# Patient Record
Sex: Female | Born: 1985 | Race: White | Hispanic: No | Marital: Married | Smoking: Never smoker
Health system: Southern US, Community
[De-identification: ages and names within clinical notes are randomized; demographics above are authoritative.]

## PROBLEM LIST (undated history)

## (undated) DIAGNOSIS — Z87440 Personal history of urinary (tract) infections: Secondary | ICD-10-CM

## (undated) DIAGNOSIS — Z862 Personal history of diseases of the blood and blood-forming organs and certain disorders involving the immune mechanism: Secondary | ICD-10-CM

## (undated) DIAGNOSIS — F909 Attention-deficit hyperactivity disorder, unspecified type: Secondary | ICD-10-CM

## (undated) DIAGNOSIS — S92912A Unspecified fracture of left toe(s), initial encounter for closed fracture: Secondary | ICD-10-CM

## (undated) DIAGNOSIS — S62109A Fracture of unspecified carpal bone, unspecified wrist, initial encounter for closed fracture: Secondary | ICD-10-CM

## (undated) DIAGNOSIS — F32A Depression, unspecified: Secondary | ICD-10-CM

## (undated) DIAGNOSIS — F419 Anxiety disorder, unspecified: Secondary | ICD-10-CM

## (undated) DIAGNOSIS — T7840XA Allergy, unspecified, initial encounter: Secondary | ICD-10-CM

## (undated) DIAGNOSIS — S42401D Unspecified fracture of lower end of right humerus, subsequent encounter for fracture with routine healing: Secondary | ICD-10-CM

## (undated) DIAGNOSIS — F329 Major depressive disorder, single episode, unspecified: Secondary | ICD-10-CM

## (undated) DIAGNOSIS — N83209 Unspecified ovarian cyst, unspecified side: Secondary | ICD-10-CM

## (undated) HISTORY — DX: Personal history of diseases of the blood and blood-forming organs and certain disorders involving the immune mechanism: Z86.2

## (undated) HISTORY — PX: OTHER SURGICAL HISTORY: SHX169

## (undated) HISTORY — DX: Fracture of unspecified carpal bone, unspecified wrist, initial encounter for closed fracture: S62.109A

## (undated) HISTORY — DX: Unspecified fracture of lower end of right humerus, subsequent encounter for fracture with routine healing: S42.401D

## (undated) HISTORY — DX: Unspecified ovarian cyst, unspecified side: N83.209

## (undated) HISTORY — DX: Personal history of urinary (tract) infections: Z87.440

## (undated) HISTORY — DX: Depression, unspecified: F32.A

## (undated) HISTORY — DX: Attention-deficit hyperactivity disorder, unspecified type: F90.9

## (undated) HISTORY — DX: Unspecified fracture of left toe(s), initial encounter for closed fracture: S92.912A

## (undated) HISTORY — DX: Allergy, unspecified, initial encounter: T78.40XA

## (undated) HISTORY — DX: Anxiety disorder, unspecified: F41.9

## (undated) HISTORY — PX: DENTAL SURGERY: SHX609

---
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
## 2008-08-21 HISTORY — PX: DENTAL SURGERY: SHX609

## 2012-11-07 DIAGNOSIS — F909 Attention-deficit hyperactivity disorder, unspecified type: Secondary | ICD-10-CM | POA: Insufficient documentation

## 2013-01-23 DIAGNOSIS — R87619 Unspecified abnormal cytological findings in specimens from cervix uteri: Secondary | ICD-10-CM | POA: Insufficient documentation

## 2019-11-10 ENCOUNTER — Ambulatory Visit: Payer: Self-pay | Attending: Internal Medicine

## 2019-11-10 DIAGNOSIS — Z23 Encounter for immunization: Secondary | ICD-10-CM

## 2019-11-10 NOTE — Progress Notes (Addendum)
   Covid-19 Vaccination Clinic  Name:  Maelle Sheaffer    MRN: [id number] DOB: 06-08-1986  11/10/2019  [name] was observed post Covid-19 immunization for 30 minutes without incident. She was provided with Vaccine Information Sheet and instruction to access the V-Safe system.   Ms. Wilhemina Bonito was instructed to call 911 with any severe reactions post vaccine: Marland Kitchen Difficulty breathing  . Swelling of face and throat  . A fast heartbeat  . A bad rash all over body  . Dizziness and weakness   Immunizations Administered    Name Date Dose VIS Date Route   Pfizer COVID-19 Vaccine 11/10/2019 11:03 AM 0.3 mL 08/01/2019 Intramuscular   Manufacturer: [employer], [employer]   Lot: [id number]   NDC: [address]-3

## 2019-11-10 NOTE — Progress Notes (Signed)
   Covid-19 Vaccination Clinic  Name:  Jessica Aguilar    MRN: [id number] DOB: 1986-06-12  11/10/2019  [name] was observed post Covid-19 immunization for 30 minutes based on pre-vaccination screening .  During the observation period, she experienced an adverse reaction with the following symptoms:  dizziness and anxiety  Assessment : Time of assessment 1129am. Alert and oriented, anxious.  Actions taken: took to stretcher, VS taken- BP- 124/84. P-79, O2 - 100%, Patient eating crackers and drinking water, recheck VS- BP- 123/89. P- 82. O2 - 100%     Medications administered: No medication administered.  Disposition: Reports no further symptoms of adverse reaction after observation for 30 minutes. Discharged home.   Immunizations Administered    Name Date Dose VIS Date Route   Pfizer COVID-19 Vaccine 11/10/2019 11:03 AM 0.3 mL 08/01/2019 Intramuscular   Manufacturer: [employer], [employer]   Lot: [id number]   NDC: [address]-0

## 2019-11-14 ENCOUNTER — Telehealth: Payer: Self-pay | Admitting: *Deleted

## 2019-11-14 NOTE — Telephone Encounter (Signed)
An attempt has been made to call Jessica Aguilar regarding the COVID vaccine that she received on March 22,2021 to see how she was feeling post vaccine. Left a detailed voicemail asking for the patient to call me back at [phone] to go over how she was feeling in detail.

## 2019-11-18 NOTE — Telephone Encounter (Signed)
A second attempt has been made to call the patient. Left a voicemail asking for the patient to call back to discuss the COVID vaccine with any questions or concerns.

## 2019-11-24 ENCOUNTER — Telehealth: Payer: Self-pay | Admitting: Obstetrics and Gynecology

## 2019-11-24 NOTE — Telephone Encounter (Signed)
Pt called in and stated that she got her period after 8 months and the pt is in some pain. She is having back pain. The pt called in and said her periods are not regular. Pt is requesting a  call back. Please advise

## 2019-11-24 NOTE — Telephone Encounter (Signed)
Unfortunately patient has never been seen in this office so unable to give give medical advise over the phone. She is scheduled for 12/09/2019. I have offered an earlier appointment, but patient is unable to do a sooner date.

## 2019-11-26 NOTE — Telephone Encounter (Signed)
Patient called returning Jessica Aguilar's voicemail. Asked patient if Apolonio Schneiders could call her back, patient stated she was at work and asked to relay a message. Patient states she is doing fine and ready for her next vaccine next week. Patient states she did have some dizziness after, but was uncertain if that was from the vaccine or if she was hungry. Patient states she was a little nauseous at work the next three days, but informs she is not pregnant.  Please advise.

## 2019-11-26 NOTE — Telephone Encounter (Signed)
Dr. Dellis Anes please advise if this is any need for concern. Thank You.

## 2019-11-27 NOTE — Telephone Encounter (Signed)
I agree - I do not think that there is anything else needed at this point. Thank you for reaching out to her to discuss!   Malachi Bonds, MD [facility]

## 2019-11-27 NOTE — Telephone Encounter (Signed)
Called and left detailed voicemail per patient's request. Asked for patient to call back with any further questions or concerns.

## 2019-12-03 ENCOUNTER — Ambulatory Visit: Payer: Self-pay | Attending: Internal Medicine

## 2019-12-03 DIAGNOSIS — Z23 Encounter for immunization: Secondary | ICD-10-CM

## 2019-12-03 NOTE — Progress Notes (Signed)
   Covid-19 Vaccination Clinic  Name:  Jessica Aguilar    MRN: [id number] DOB: 1986-05-30  12/03/2019  Jessica Aguilar was observed post Covid-19 immunization for 15 minutes without incident. She was provided with Vaccine Information Sheet and instruction to access the V-Safe system.   Jessica Aguilar was instructed to call 911 with any severe reactions post vaccine: Marland Kitchen Difficulty breathing  . Swelling of face and throat  . A fast heartbeat  . A bad rash all over body  . Dizziness and weakness   Immunizations Administered    Name Date Dose VIS Date Route   Pfizer COVID-19 Vaccine 12/03/2019  2:52 PM 0.3 mL 08/01/2019 Intramuscular   Manufacturer: [employer], [employer]   Lot: [id number]   NDC: [address]-5

## 2019-12-09 ENCOUNTER — Encounter: Payer: Self-pay | Admitting: Obstetrics and Gynecology

## 2019-12-09 ENCOUNTER — Other Ambulatory Visit: Payer: Self-pay

## 2019-12-09 ENCOUNTER — Ambulatory Visit (INDEPENDENT_AMBULATORY_CARE_PROVIDER_SITE_OTHER): Payer: 59 | Admitting: Obstetrics and Gynecology

## 2019-12-09 VITALS — BP 105/73 | HR 79 | Ht 67.0 in | Wt 190.4 lb

## 2019-12-09 DIAGNOSIS — R102 Pelvic and perineal pain: Secondary | ICD-10-CM | POA: Diagnosis not present

## 2019-12-09 DIAGNOSIS — N938 Other specified abnormal uterine and vaginal bleeding: Secondary | ICD-10-CM | POA: Diagnosis not present

## 2019-12-09 DIAGNOSIS — Z3009 Encounter for other general counseling and advice on contraception: Secondary | ICD-10-CM | POA: Diagnosis not present

## 2019-12-09 MED ORDER — ETONOGESTREL-ETHINYL ESTRADIOL 0.12-0.015 MG/24HR VA RING: VAGINAL_RING | VAGINAL | 1 refills | Status: DC

## 2019-12-09 NOTE — Progress Notes (Signed)
HPI:      [name] is a 34 y.o. No obstetric history on file. who LMP was Patient's last menstrual period was 11/21/2019.  Subjective:   She presents today with complaint of irregular menstrual periods.  She is also complaining of occasional pelvic cramping-is currently bleeding.  States that her cramping is present but "not bad enough to take ibuprofen". Patient states that she has had irregular cycles her whole life. She was on Nexplanon for a while and felt it increased her anxiety and stress.  She had it removed but her anxiety and stress did not resolve.  She began Depo-Provera but did not like the weight gain.  She stopped Depo-Provera in February.  Since that time she has had some irregular bleeding. Patient reports a family history of endometriosis.  She has never been diagnosed with this. She states that she strongly dislikes birth control pills.  She says she cannot take pills very well.    Hx: The following portions of the patient's history were reviewed and updated as appropriate:             She  has a past medical history of ADHD, Anxiety, Depression, and Ovarian cyst. She does not have a problem list on file. She  has no past surgical history on file. Her family history includes Arthritis in her maternal grandmother; Breast cancer in her paternal grandmother; Depression in her paternal grandmother; Diabetes in her father; Heart failure in her maternal grandmother; Kidney disease in her father. She  reports that she has been smoking. She has never used smokeless tobacco. She reports current alcohol use of about 3.0 standard drinks of alcohol per week. She reports that she does not use drugs. She has a current medication list which includes the following prescription(s): etonogestrel-ethinyl estradiol. She is allergic to amoxicillin.       Review of Systems:  Review of Systems  Constitutional: Denied constitutional symptoms, night sweats, recent illness, fatigue, fever,  insomnia and weight loss.  Eyes: Denied eye symptoms, eye pain, photophobia, vision change and visual disturbance.  Ears/Nose/Throat/Neck: Denied ear, nose, throat or neck symptoms, hearing loss, nasal discharge, sinus congestion and sore throat.  Cardiovascular: Denied cardiovascular symptoms, arrhythmia, chest pain/pressure, edema, exercise intolerance, orthopnea and palpitations.  Respiratory: Denied pulmonary symptoms, asthma, pleuritic pain, productive sputum, cough, dyspnea and wheezing.  Gastrointestinal: Denied, gastro-esophageal reflux, melena, nausea and vomiting.  Genitourinary: See HPI for additional information.  Musculoskeletal: Denied musculoskeletal symptoms, stiffness, swelling, muscle weakness and myalgia.  Dermatologic: Denied dermatology symptoms, rash and scar.  Neurologic: Denied neurology symptoms, dizziness, headache, neck pain and syncope.  Psychiatric: Denied psychiatric symptoms, anxiety and depression.  Endocrine: Denied endocrine symptoms including hot flashes and night sweats.   Meds:   No current outpatient medications on file prior to visit.   No current facility-administered medications on file prior to visit.    Objective:     Vitals:   12/09/19 1059  BP: 105/73  Pulse: 79                Assessment:    No obstetric history on file. There are no problems to display for this patient.    1. Birth control counseling   2. Pelvic pain in female   3. Dysfunctional uterine bleeding     Dysfunctional bleeding likely secondary to stopping Depo-Provera.  Some dysmenorrhea/pelvic pain but "not bad enough to take ibuprofen".  She describes a history of ovarian cysts.   Plan:  1.  Based on her pelvic pain with menses, her regular cycles, her need for birth control, and the possibility of endometriosis I have recommended birth control/cycle control.  OCPs or IUD.  Both explained in detail.  Patient was not happy with either of these  choices. She has chosen NuvaRing. Recommend follow-up of NuvaRing in 3 months to see if her symptoms have improved.  Also to see if she likes the NuvaRing. Annual examination at the time of next visit. Orders No orders of the defined types were placed in this encounter.    Meds ordered this encounter  Medications  . etonogestrel-ethinyl estradiol (NUVARING) 0.12-0.015 MG/24HR vaginal ring    Sig: Insert vaginally and leave in place for 3 consecutive weeks, then remove for 1 week.    Dispense:  3 each    Refill:  1      F/U  Return in about 3 months (around 03/09/2020) for Annual Physical and NuvaRing follow-up. I spent 32 minutes involved in the care of this patient preparing to see the patient by obtaining and reviewing her medical history (including labs, imaging tests and prior procedures), documenting clinical information in the electronic health record (EHR), counseling and coordinating care plans, writing and sending prescriptions, ordering tests or procedures and directly communicating with the patient by discussing pertinent items from her history and physical exam as well as detailing my assessment and plan as noted above so that she has an informed understanding.  All of her questions were answered.  Finis Bud, M.D. 12/09/2019 11:51 AM

## 2020-01-13 ENCOUNTER — Telehealth: Payer: Self-pay | Admitting: Obstetrics and Gynecology

## 2020-01-13 NOTE — Telephone Encounter (Signed)
Please advise 

## 2020-01-13 NOTE — Telephone Encounter (Signed)
Pt called in and stated that she has a question about her BC ( the navaring). She said she did what Dr. Logan Bores told her. The pt stated that she had it in for 3 weeks. Then took it out on 16 th for her period, but she didn't start her period till  Friday night the 21st. On Sunday the 23rd she put a new navaring in . She wants to know is that right. The pt is requesting a call back. Please advise

## 2020-01-15 NOTE — Telephone Encounter (Signed)
LM to return call.

## 2020-03-10 ENCOUNTER — Encounter: Payer: 59 | Admitting: Obstetrics and Gynecology

## 2020-03-16 ENCOUNTER — Telehealth: Payer: Self-pay | Admitting: Obstetrics and Gynecology

## 2020-03-16 ENCOUNTER — Encounter: Payer: Self-pay | Admitting: Obstetrics and Gynecology

## 2020-03-16 ENCOUNTER — Ambulatory Visit (INDEPENDENT_AMBULATORY_CARE_PROVIDER_SITE_OTHER): Payer: 59 | Admitting: Obstetrics and Gynecology

## 2020-03-16 VITALS — BP 103/71 | HR 87 | Ht 67.0 in | Wt 193.7 lb

## 2020-03-16 DIAGNOSIS — Z3049 Encounter for surveillance of other contraceptives: Secondary | ICD-10-CM | POA: Diagnosis not present

## 2020-03-16 NOTE — Telephone Encounter (Signed)
Pt called in and stated that she went to take her NUVARING for the past week. The pt stated that she doesn't know if she took it out of if its still in there. The pt was requesting to be seen today. I told her that I would have to send a message. The pt stated that she started her period but she is worried and doesn't want to get TSS. The pt is requesting a call back. Please advise  

## 2020-03-16 NOTE — Telephone Encounter (Signed)
Pt called in and stated that she went to take her NUVARING for the past week. The pt stated that she doesn't know if she took it out of if its still in there. The pt was requesting to be seen today. I told her that I would have to send a message. The pt stated that she started her period but she is worried and doesn't want to get TSS. The pt is requesting a call back. Please advise

## 2020-03-16 NOTE — Telephone Encounter (Signed)
Scheduled patient to come in today at 11:30.

## 2020-03-16 NOTE — Progress Notes (Addendum)
HPI:      [name] is a 34 y.o. No obstetric history on file. who LMP was Patient's last menstrual period was 03/15/2020.  Subjective:   She presents today because she cannot find her NuvaRing and does not recall removing it.  She started her menstrual period yesterday.  She thinks it may be more than 5 weeks since it was inserted.    Hx: The following portions of the patient's history were reviewed and updated as appropriate:             She  has a past medical history of ADHD, Anxiety, Depression, and Ovarian cyst. She does not have a problem list on file. She  has no past surgical history on file. Her family history includes Arthritis in her maternal grandmother; Breast cancer in her paternal grandmother; Depression in her paternal grandmother; Diabetes in her father; Heart failure in her maternal grandmother; Kidney disease in her father. She  reports that she has been smoking. She has never used smokeless tobacco. She reports current alcohol use of about 3.0 standard drinks of alcohol per week. She reports that she does not use drugs. She has a current medication list which includes the following prescription(s): etonogestrel-ethinyl estradiol. She is allergic to amoxicillin.       Review of Systems:  Review of Systems  Constitutional: Denied constitutional symptoms, night sweats, recent illness, fatigue, fever, insomnia and weight loss.  Eyes: Denied eye symptoms, eye pain, photophobia, vision change and visual disturbance.  Ears/Nose/Throat/Neck: Denied ear, nose, throat or neck symptoms, hearing loss, nasal discharge, sinus congestion and sore throat.  Cardiovascular: Denied cardiovascular symptoms, arrhythmia, chest pain/pressure, edema, exercise intolerance, orthopnea and palpitations.  Respiratory: Denied pulmonary symptoms, asthma, pleuritic pain, productive sputum, cough, dyspnea and wheezing.  Gastrointestinal: Denied, gastro-esophageal reflux, melena, nausea and  vomiting.  Genitourinary: Denied genitourinary symptoms including symptomatic vaginal discharge, pelvic relaxation issues, and urinary complaints.  Musculoskeletal: Denied musculoskeletal symptoms, stiffness, swelling, muscle weakness and myalgia.  Dermatologic: Denied dermatology symptoms, rash and scar.  Neurologic: Denied neurology symptoms, dizziness, headache, neck pain and syncope.  Psychiatric: Denied psychiatric symptoms, anxiety and depression.  Endocrine: Denied endocrine symptoms including hot flashes and night sweats.   Meds:   Current Outpatient Medications on File Prior to Visit  Medication Sig Dispense Refill  . etonogestrel-ethinyl estradiol (NUVARING) 0.12-0.015 MG/24HR vaginal ring Insert vaginally and leave in place for 3 consecutive weeks, then remove for 1 week. 3 each 1   No current facility-administered medications on file prior to visit.    Objective:     Vitals:   03/16/20 1120  BP: 103/71  Pulse: 87              Physical examination   Pelvic:   Vulva: Normal appearance.  No lesions.  Vagina: No lesions or abnormalities noted.  Support: Normal pelvic support.  Urethra No masses tenderness or scarring.  Meatus Normal size without lesions or prolapse.  Cervix: Normal appearance.  No lesions.  Anus: Normal exam.  No lesions.  Perineum: Normal exam.  No lesions.        Bimanual   Uterus: Normal size.  Non-tender.  Mobile.  AV.  Adnexae: No masses.  Non-tender to palpation.  Cul-de-sac: Negative for abnormality.     Assessment:    No obstetric history on file. There are no problems to display for this patient.    1. Encounter for surveillance of nuvaring     No evidence of vaginal NuvaRing present.  Patient is bleeding consistent with menses.   Plan:            1.  Patient to start next NuvaRing on schedule Sunday.  2.  Reassured regarding pregnancy and use of NuvaRing.  Orders No orders of the defined types were placed in this  encounter.   No orders of the defined types were placed in this encounter.     F/U  No follow-ups on file. I spent 21 minutes involved in the care of this patient preparing to see the patient by obtaining and reviewing her medical history (including labs, imaging tests and prior procedures), documenting clinical information in the electronic health record (EHR), counseling and coordinating care plans, writing and sending prescriptions, ordering tests or procedures and directly communicating with the patient by discussing pertinent items from her history and physical exam as well as detailing my assessment and plan as noted above so that she has an informed understanding.  All of her questions were answered.  Elonda Husky, M.D. 03/16/2020 11:57 AM

## 2020-04-07 ENCOUNTER — Other Ambulatory Visit: Payer: Self-pay

## 2020-04-07 DIAGNOSIS — Z20822 Contact with and (suspected) exposure to covid-19: Secondary | ICD-10-CM

## 2020-04-08 LAB — NOVEL CORONAVIRUS, NAA: SARS-CoV-2, NAA: NOT DETECTED

## 2020-04-08 LAB — SARS-COV-2, NAA 2 DAY TAT

## 2020-06-25 ENCOUNTER — Ambulatory Visit: Payer: Self-pay

## 2020-10-27 ENCOUNTER — Ambulatory Visit: Payer: Self-pay

## 2020-12-29 ENCOUNTER — Ambulatory Visit: Payer: Self-pay

## 2020-12-31 ENCOUNTER — Encounter: Payer: Self-pay | Admitting: Family Medicine

## 2020-12-31 ENCOUNTER — Ambulatory Visit (LOCAL_COMMUNITY_HEALTH_CENTER): Payer: Self-pay | Admitting: Family Medicine

## 2020-12-31 ENCOUNTER — Other Ambulatory Visit: Payer: Self-pay

## 2020-12-31 VITALS — BP 104/72 | HR 80 | Ht 67.0 in | Wt 189.6 lb

## 2020-12-31 DIAGNOSIS — Z789 Other specified health status: Secondary | ICD-10-CM

## 2020-12-31 DIAGNOSIS — Z3009 Encounter for other general counseling and advice on contraception: Secondary | ICD-10-CM

## 2020-12-31 DIAGNOSIS — Z30012 Encounter for prescription of emergency contraception: Secondary | ICD-10-CM

## 2020-12-31 DIAGNOSIS — Z0000 Encounter for general adult medical examination without abnormal findings: Secondary | ICD-10-CM

## 2020-12-31 DIAGNOSIS — Z30013 Encounter for initial prescription of injectable contraceptive: Secondary | ICD-10-CM

## 2020-12-31 MED ORDER — MEDROXYPROGESTERONE ACETATE 150 MG/ML IM SUSP
150.0000 mg | INTRAMUSCULAR | Status: AC
  Administered 2020-12-31 – 2021-03-29 (×2): 150 mg via INTRAMUSCULAR

## 2020-12-31 MED ORDER — ELLA 30 MG PO TABS: 1.0000 | ORAL_TABLET | Freq: Once | ORAL | 0 refills | Status: AC

## 2020-12-31 NOTE — Progress Notes (Signed)
[facility] [address] Main Number: [phone]    Family Planning Visit- Initial Visit  Subjective:  Jessica Aguilar is a 35 y.o.  No obstetric history on file.   being seen today for an initial annual visit and to discuss contraceptive options.  The patient is currently using None for pregnancy prevention. Patient reports she does not want a pregnancy in the next year.  Patient has the following medical conditions does not have a problem list on file.  Chief Complaint  Patient presents with  . Annual Exam    Patient reports here for physical and start Depo   Patient denies any problems or concerns   Body mass index is 29.7 kg/m. - Patient is eligible for diabetes screening based on BMI and age >23?  not applicable HA1C ordered? not applicable  Patient reports 1  partner/s in last year. Desires STI screening?  No - declines   Has patient been screened once for HCV in the past?  No  No results found for: HCVAB  Does the patient have current drug use (including MJ), have a partner with drug use, and/or has been incarcerated since last result? No  If yes-- Screen for HCV through [facility]   Does the patient meet criteria for HBV testing? No  Criteria:  -Household, sexual or needle sharing contact with HBV -History of drug use -HIV positive -Those with known Hep C   Health Maintenance Due  Topic Date Due  . HIV Screening  Never done  . Hepatitis C Screening  Never done  . TETANUS/TDAP  Never done  . PAP SMEAR-Modifier  Never done  . COVID-19 Vaccine (3 - Booster for Pfizer series) 05/04/2020    Review of Systems  Constitutional: Negative for chills, fever, malaise/fatigue and weight loss.  HENT: Negative for congestion, hearing loss and sore throat.   Eyes: Negative for blurred vision, double vision and photophobia.  Respiratory: Negative for shortness of breath.   Cardiovascular: Negative for chest  pain.  Gastrointestinal: Negative for abdominal pain, blood in stool, constipation, diarrhea, heartburn, nausea and vomiting.  Genitourinary: Negative for dysuria and frequency.  Musculoskeletal: Negative for back pain, joint pain and neck pain.  Skin: Negative for itching and rash.  Neurological: Negative for dizziness, weakness and headaches.  Endo/Heme/Allergies: Does not bruise/bleed easily.  Psychiatric/Behavioral: Negative for depression, substance abuse and suicidal ideas.    The following portions of the patient's history were reviewed and updated as appropriate: allergies, current medications, past family history, past medical history, past social history, past surgical history and problem list. Problem list updated.   See flowsheet for other program required questions.  Objective:   Vitals:   12/31/20 0933  BP: 104/72  Pulse: 80  Weight: 189 lb 9.6 oz (86 kg)  Height: 5\' 7"  (1.702 m)    Physical Exam Vitals and nursing note reviewed.  Constitutional:      Appearance: Normal appearance.  HENT:     Head: Normocephalic and atraumatic.     Mouth/Throat:     Mouth: Mucous membranes are moist.     Dentition: Normal dentition.     Pharynx: No oropharyngeal exudate or posterior oropharyngeal erythema.  Eyes:     General: No scleral icterus. Neck:     Thyroid: No thyroid mass, thyromegaly or thyroid tenderness.  Cardiovascular:     Rate and Rhythm: Normal rate.     Pulses: Normal pulses.  Pulmonary:  Effort: Pulmonary effort is normal.  Chest:     Comments: Breasts:        Right: Normal. No swelling, mass, nipple discharge, skin change or tenderness.        Left: Normal. No swelling, mass, nipple discharge, skin change or tenderness.   Abdominal:     General: Abdomen is flat. Bowel sounds are normal.     Palpations: Abdomen is soft.     Tenderness: There is guarding.     Comments: Tenderness with bilateral lower quadrants.  Patient reports history of ovarian  cyst, and has an occasional flare.     Genitourinary:    Comments: Deferred  Musculoskeletal:        General: Normal range of motion.  Skin:    General: Skin is warm and dry.  Neurological:     General: No focal deficit present.     Mental Status: She is alert.       Assessment and Plan:  Jessica Aguilar is a 35 y.o. female presenting to the [facility] Department for an initial annual wellness/contraceptive visit   1. Routine general medical examination at a health care facility -Well woman exam today -Clinical breast exam  ROI signed for pap records from [facility]. HD.  Pt reports last pap was 2 yrs ago.   -declines need for pap or STI testing today.   - pt BLQ tenderness- pt reports history or ovarian cysts and have occasional tenderness and was being followed by obgyn when had insurance.  Per patient was last seen at [facility].    3. Encounter for initial prescription of injectable contraceptive RX : DMPA 150 mg IM Q 11-13 weeks x 1 year  - medroxyPROGESTERone (DEPO-PROVERA) injection 150 mg  2. Family planning counseling  Contraception counseling: Reviewed all forms of birth control options in the tiered based approach. available including abstinence; over the counter/barrier methods; hormonal contraceptive medication including pill, patch, ring, injection,contraceptive implant, ECP; hormonal and nonhormonal IUDs; permanent sterilization options including vasectomy and the various tubal sterilization modalities. Risks, benefits, and typical effectiveness rates were reviewed.  Questions were answered.  Written information was also given to the patient to review.  Patient desires Depo Provera, this was prescribed for patient. She will follow up in  11-13 weeks  for surveillance.  She was told to call with any further questions, or with any concerns about this method of contraception.  Emphasized use of condoms 100% of the time for STI prevention.  4. Emergency  contraception  Patient was offered ECP based on last sex. ECP was accepted by the patient. ECP counseling was given - see RN documentation  - ulipristal acetate (ELLA) 30 MG tablet; Take 1 tablet (30 mg total) by mouth once for 1 dose.  Dispense: 1 tablet; Refill: 0     No follow-ups on file.  No future appointments.  Wendi Snipes, FNP

## 2020-12-31 NOTE — Progress Notes (Signed)
Presents for annual exam stating desires Depo for birth control. Declines to provide OB history, Jossie Ng, RN ROI for [facility] Department PAP reports signed, faxed and fax confirmation received. Tolerated Depo without complaint. Client aware to take UPT 01/05/2021 and notify clinic if positive. Jossie Ng, RN

## 2021-03-22 ENCOUNTER — Ambulatory Visit: Payer: Self-pay

## 2021-03-29 ENCOUNTER — Other Ambulatory Visit: Payer: Self-pay

## 2021-03-29 ENCOUNTER — Encounter: Payer: Self-pay | Admitting: Family Medicine

## 2021-03-29 ENCOUNTER — Ambulatory Visit (LOCAL_COMMUNITY_HEALTH_CENTER): Payer: Self-pay | Admitting: Family Medicine

## 2021-03-29 VITALS — BP 107/72 | HR 88 | Temp 98.2°F | Resp 18 | Ht 67.0 in | Wt 190.0 lb

## 2021-03-29 DIAGNOSIS — Z3009 Encounter for other general counseling and advice on contraception: Secondary | ICD-10-CM

## 2021-03-29 DIAGNOSIS — Z30013 Encounter for initial prescription of injectable contraceptive: Secondary | ICD-10-CM

## 2021-03-29 DIAGNOSIS — N939 Abnormal uterine and vaginal bleeding, unspecified: Secondary | ICD-10-CM

## 2021-03-29 NOTE — Progress Notes (Signed)
Depo 150 mg IM given left deltoid per order by Elveria Rising, FNP dated 12/31/20. Tolerated well. Next depo due 06/14/21, reminder card given.   Floy Sabina, RN

## 2021-04-01 ENCOUNTER — Encounter: Payer: Self-pay | Admitting: Family Medicine

## 2021-04-01 NOTE — Progress Notes (Signed)
   [facility]  [facility]  Subjective:  Jessica Aguilar is a 35 y.o. being seen today for   Chief Complaint  Patient presents with   Contraception    Depo due     HPI   Does the patient have a current or past history of drug use? Yes   No components found for: HCV]   Health Maintenance Due  Topic Date Due   Pneumococcal Vaccine 87-33 Years old (1 - PCV) Never done   HIV Screening  Never done   Hepatitis C Screening  Never done   TETANUS/TDAP  Never done   PAP SMEAR-Modifier  Never done   COVID-19 Vaccine (3 - Booster for Pfizer series) 05/04/2020   INFLUENZA VACCINE  03/21/2021    Review of Systems  Constitutional:  Negative for chills, fever, malaise/fatigue and weight loss.  HENT:  Negative for congestion, hearing loss and sore throat.   Eyes:  Negative for blurred vision, double vision and photophobia.  Respiratory:  Negative for shortness of breath.   Cardiovascular:  Negative for chest pain.  Gastrointestinal:  Negative for abdominal pain, blood in stool, constipation, diarrhea, heartburn, nausea and vomiting.  Genitourinary:  Negative for dysuria and frequency.  Musculoskeletal:  Negative for back pain, joint pain and neck pain.  Skin:  Negative for itching and rash.  Neurological:  Negative for dizziness, weakness and headaches.  Endo/Heme/Allergies:  Does not bruise/bleed easily.  Psychiatric/Behavioral:  Negative for depression, substance abuse and suicidal ideas.    The following portions of the patient's history were reviewed and updated as appropriate: allergies, current medications, past family history, past medical history, past social history, past surgical history and problem list. Problem list updated.   See flowsheet for other program required questions.  Objective:   Vitals:   03/29/21 1510  BP: 107/72  Pulse: 88  Resp: 18  Temp: 98.2 F (36.8 C)  TempSrc: Oral  Weight: 190 lb (86.2 kg)   Height: 5\' 7"  (1.702 m)    Physical Exam Constitutional:      Appearance: Normal appearance.  HENT:     Head: Normocephalic and atraumatic.  Musculoskeletal:     Cervical back: Normal range of motion and neck supple.  Skin:    General: Skin is warm and dry.  Neurological:     Mental Status: She is alert and oriented to person, place, and time.  Psychiatric:        Mood and Affect: Mood normal.        Behavior: Behavior normal.      Assessment and Plan:  Jessica Aguilar is a 35 y.o. female presenting to the [facility] Department for a Women's Health problem visit   1. Abnormal vaginal bleeding Pt reporting vaginal bleeding for 3 weeks, pt started depo provera as birth control method on 01/05/21.  Discussed break through bleeding and that it can be some abnormal bleeding for up to 6-9 months on new method.  Patient instructed to take 800 mg Ibuprofen TID x 5 days.  To reduce/stop bleeding.  If bleeding continues to contact clinic for other instructions.   Patient verbalizes understanding.    Depo given today by RN.  See RN documentation.    Return in 11 years (on 03/29/2032).  No future appointments.  05/29/2032, FNP

## 2021-07-11 ENCOUNTER — Ambulatory Visit: Payer: Self-pay | Admitting: *Deleted

## 2021-07-11 NOTE — Telephone Encounter (Signed)
See triage notes.    Pt calling in regarding having palpitations.   It only happens intermittently and mostly at night.

## 2021-07-11 NOTE — Telephone Encounter (Signed)
Reason for Disposition  [1] Heart beating very rapidly (e.g., > 140 / minute) AND [2] not present now  (Exception: during exercise)    You have decided to go to the ED next it happens.  Answer Assessment - Initial Assessment Questions 1. DESCRIPTION: "Describe your child's heart rate or heart beat." (e.g., fast, slow, irregular)     I feel like my heart is racing last night.  I've been seen in the ED for this before.  I think it's anxiety.   No chest pain.   No shortness of breath.   It scares me when it happens.    2. ONSET: "When did it start?" (Minutes, hours or days)      It feels like for 4 hours it was racing and would not stop.   Maybe PVCs.   I saw a cardiologist.   It happens once or twice a week.    I wore a heart monitor for a week and no one called me back about it.   Last occurrence was last night 9:00 PM.   I saw a cardiologist in [place], [place] 3. DURATION: "How long did it last?" (e.g., seconds, minutes, hours)     The palpitations are like a second and then other times it feels like a heart attack without the change.   There's no chest pain.    4. PATTERN: "Does it come and go, or has it been constant since it started?"  "Is it present now?"     It's intermittent.    It mainly happens at night.   I don't want to go to the ED.   I feel like it a waste of time to go to the ED.  5. HEART RATE: "Can you tell me the heart rate in beats per minute?" "How many beats in 15 seconds?"  (Note: not all patients can do this)       Not able to measure it 6. RECURRENT SYMPTOM: "Has your child ever had this before?" If so, ask: "When was the last time?" and "What happened that time?"      Yes this has been happening summer of 2017. 7. CAUSE: "What do you think is causing the unusual heart rate?" (e.g., caffeine, medicines, exercise, fear, pain)     Maybe anxiety.   I've talked with friends who have anxiety and they have these same symptoms.    8. CARDIAC HISTORY: "Does your child have any history  of heart disease or heart surgery?"      No history of heart problems myself. 9. CHILD'S APPEARANCE: "How sick is your child acting?" " What is he doing right now?" If asleep, ask: "How was he acting before he went to sleep?"     N/A   This is an adult.  Answer Assessment - Initial Assessment Questions 1. DESCRIPTION: "Please describe your heart rate or heartbeat that you are having" (e.g., fast/slow, regular/irregular, skipped or extra beats, "palpitations")     Pt calling in.   She is new to the area.   She is having intermittent episodes of palpitations.   Denies shortness of breath or chest pain when it happens.   No cardiac history.   It happens mostly at night. 2. ONSET: "When did it start?" (Minutes, hours or days)      Since 2017 3. DURATION: "How long does it last" (e.g., seconds, minutes, hours)     Anywhere from a few seconds to 4 hours one time. 4. PATTERN "Does it  come and go, or has it been constant since it started?"  "Does it get worse with exertion?"   "Are you feeling it now?"     Intermittent 5. TAP: "Using your hand, can you tap out what you are feeling on a chair or table in front of you, so that I can hear?" (Note: not all patients can do this)       Unable to do as "I feel fine right now". 6. HEART RATE: "Can you tell me your heart rate?" "How many beats in 15 seconds?"  (Note: not all patients can do this)       No 7. RECURRENT SYMPTOM: "Have you ever had this before?" If Yes, ask: "When was the last time?" and "What happened that time?"      Yes  I saw a cardiologist in [place], [place] and wore a monitor for a week but I never heard the results of it.   I don't live there anymore. 8. CAUSE: "What do you think is causing the palpitations?"     I'm wondering if I have anxiety but it scares me when it happens so I'm wondering if I have underlying heart disease or something.  I'm not sure. 9. CARDIAC HISTORY: "Do you have any history of heart disease?" (e.g., heart attack,  angina, bypass surgery, angioplasty, arrhythmia)      No history of heart problems other than wearing the monitor as mentioned above and intermittent palpitations on and off since 2017 approximately. 10. OTHER SYMPTOMS: "Do you have any other symptoms?" (e.g., dizziness, chest pain, sweating, difficulty breathing)       Denies any of symptoms with it. 11. PREGNANCY: "Is there any chance you are pregnant?" "When was your last menstrual period?"       Not asked  Protocols used: Heart Rate and Heart Beat Questions-P-AH, Heart Rate and Heartbeat Questions-A-AH

## 2021-07-24 ENCOUNTER — Other Ambulatory Visit: Payer: Self-pay

## 2021-07-24 ENCOUNTER — Ambulatory Visit
Admission: EM | Admit: 2021-07-24 | Discharge: 2021-07-24 | Disposition: A | Discharge status: Home / Self Care | Payer: Commercial Managed Care - PPO | Attending: Emergency Medicine | Admitting: Emergency Medicine

## 2021-07-24 DIAGNOSIS — H1013 Acute atopic conjunctivitis, bilateral: Secondary | ICD-10-CM

## 2021-07-24 MED ORDER — CETIRIZINE HCL 10 MG PO TABS: 10.0000 mg | ORAL_TABLET | Freq: Every day | ORAL | 0 refills | Status: DC

## 2021-07-24 MED ORDER — AZELASTINE HCL 0.05 % OP SOLN: 1.0000 [drp] | Freq: Four times a day (QID) | OPHTHALMIC | 1 refills | Status: DC

## 2021-07-24 MED ORDER — FLUTICASONE PROPIONATE 50 MCG/ACT NA SUSP: 1.0000 | Freq: Two times a day (BID) | NASAL | 0 refills | Status: DC

## 2021-07-24 NOTE — ED Provider Notes (Signed)
[facility]  ____________________________________________  Time seen: Approximately 11:06 AM  I have reviewed the triage vital signs and the nursing notes.   HISTORY  Chief Complaint Eye Problem    HPI Jessica Aguilar is a 35 y.o. female who presents the urgent care with complaint of bilateral eye irritation.  There is no conjunctival erythema or irritation.  No drainage.  She states that both of her eyes feel very itchy and her eyelids feel gritty.  Patient states that she has had some redness to the eyelids bilaterally.  Again no conjunctival irritation, no drainage.  Patient states that she put on her make-up yesterday but it is the make-up she typically wears.  There is no new foods, medications.  No new skin products or topicals.  No medications prior to arrival.  Patient does not wear glasses or contacts       History reviewed. No pertinent past medical history.  There are no problems to display for this patient.   Past Surgical History:  Procedure Laterality Date   DENTAL SURGERY Bilateral     Prior to Admission medications   Medication Sig Start Date End Date Taking? Authorizing Provider  azelastine (OPTIVAR) 0.05 % ophthalmic solution Place 1 drop into both eyes 4 (four) times daily. 07/24/21  Yes Karon Cotterill, Delorise Royals, PA-C  cetirizine (ZYRTEC) 10 MG tablet Take 1 tablet (10 mg total) by mouth daily. 07/24/21  Yes Nyeem Stoke, Delorise Royals, PA-C  fluticasone (FLONASE) 50 MCG/ACT nasal spray Place 1 spray into both nostrils 2 (two) times daily. 07/24/21  Yes Fumie Fiallo, Delorise Royals, PA-C    Allergies Amoxicillin  History reviewed. No pertinent family history.  Social History Social History   Tobacco Use   Smoking status: Never   Smokeless tobacco: Never  Vaping Use   Vaping Use: Never used  Substance Use Topics   Alcohol use: Yes    Alcohol/week: 1.0 standard drink    Types: 1 Glasses of wine per week   Drug use: Never     Review of Systems   Constitutional: No fever/chills Eyes: No visual changes. No discharge.  Bilateral eye irritation ENT: No upper respiratory complaints. Cardiovascular: no chest pain. Respiratory: no cough. No SOB. Gastrointestinal: No abdominal pain.  No nausea, no vomiting.  No diarrhea.  No constipation. Musculoskeletal: Negative for musculoskeletal pain. Skin: Negative for rash, abrasions, lacerations, ecchymosis. Neurological: Negative for headaches, focal weakness or numbness.  10 System ROS otherwise negative.  ____________________________________________   PHYSICAL EXAM:  VITAL SIGNS: ED Triage Vitals  Enc Vitals Group     BP 07/24/21 1046 111/78     Pulse Rate 07/24/21 1046 87     Resp 07/24/21 1046 16     Temp 07/24/21 1046 98.4 F (36.9 C)     Temp Source 07/24/21 1046 Oral     SpO2 07/24/21 1046 100 %     Weight --      Height --      Head Circumference --      Peak Flow --      Pain Score 07/24/21 1045 0     Pain Loc --      Pain Edu? --      Excl. in [place]? --      Constitutional: Alert and oriented. Well appearing and in no acute distress. Eyes: Conjunctivae are normal. PERRL. EOMI. no conjunctival irritation.  Patient does have some mild erythema and minimal edema along the bilateral superior and inferior eyelids.  There is no drainage.  Funduscopic exam is reassuring bilaterally. Head: Atraumatic. ENT:      Ears:       Nose: No congestion/rhinnorhea.      Mouth/Throat: Mucous membranes are moist.  Neck: No stridor.    Cardiovascular: Normal rate, regular rhythm. Normal S1 and S2.  Good peripheral circulation. Respiratory: Normal respiratory effort without tachypnea or retractions. Lungs CTAB. Good air entry to the bases with no decreased or absent breath sounds. Musculoskeletal: Full range of motion to all extremities. No gross deformities appreciated. Neurologic:  Normal speech and language. No gross focal neurologic deficits are appreciated.  Skin:  Skin is warm, dry  and intact. No rash noted. Psychiatric: Mood and affect are normal. Speech and behavior are normal. Patient exhibits appropriate insight and judgement.   ____________________________________________   LABS (all labs ordered are listed, but only abnormal results are displayed)  Labs Reviewed - No data to display ____________________________________________  EKG   ____________________________________________  RADIOLOGY   No results found.  ____________________________________________    PROCEDURES  Procedure(s) performed:    Procedures    Medications - No data to display   ____________________________________________   INITIAL IMPRESSION / ASSESSMENT AND PLAN / ED COURSE  Pertinent labs & imaging results that were available during my care of the patient were reviewed by me and considered in my medical decision making (see chart for details).  Review of the [place] CSRS was performed in accordance of the [facility] prior to dispensing any controlled drugs.           Patient's diagnosis is consistent with allergic conjunctivitis.  Patient presents to the urgent care with complaint of bilateral eye irritation.  Patient has not used any new lotions, topicals, foods or medications.  It appears to be allergic conjunctivitis.  No evidence of bacterial or viral conjunctivitis.  No chemical exposure for concerns about chemical conjunctivitis.  Patient will have oral antihistamine, Optivar for symptom relief.  Follow-up with primary care as needed..  Patient is given ED precautions to return to the ED for any worsening or new symptoms.     ____________________________________________  FINAL CLINICAL IMPRESSION(S) / DIAGNOSES  Final diagnoses:  Allergic conjunctivitis of both eyes      NEW MEDICATIONS STARTED DURING THIS VISIT:  ED Discharge Orders          Ordered    cetirizine (ZYRTEC) 10 MG tablet  Daily        07/24/21 1110    fluticasone (FLONASE) 50 MCG/ACT  nasal spray  2 times daily        07/24/21 1110    azelastine (OPTIVAR) 0.05 % ophthalmic solution  4 times daily        07/24/21 1110                This chart was dictated using voice recognition software/Dragon. Despite best efforts to proofread, errors can occur which can change the meaning. Any change was purely unintentional.    Racheal Patches, PA-C 07/24/21 1110

## 2021-07-24 NOTE — ED Triage Notes (Addendum)
Patient presents to Urgent Care with complaints of bilateral eyelids swelling and irritation x 1.5 weeks ago. Not treating irritation. Believes it may be an allergic reaction.  Denies fever or eye drainage.

## 2021-08-24 ENCOUNTER — Encounter: Payer: 59 | Admitting: Obstetrics and Gynecology

## 2021-08-29 ENCOUNTER — Ambulatory Visit
Admission: EM | Admit: 2021-08-29 | Discharge: 2021-08-29 | Disposition: A | Discharge status: Home / Self Care | Payer: Commercial Managed Care - PPO | Attending: Emergency Medicine | Admitting: Emergency Medicine

## 2021-08-29 ENCOUNTER — Encounter: Payer: Self-pay | Admitting: Emergency Medicine

## 2021-08-29 ENCOUNTER — Other Ambulatory Visit: Payer: Self-pay

## 2021-08-29 DIAGNOSIS — B3731 Acute candidiasis of vulva and vagina: Secondary | ICD-10-CM | POA: Diagnosis not present

## 2021-08-29 DIAGNOSIS — N760 Acute vaginitis: Secondary | ICD-10-CM | POA: Insufficient documentation

## 2021-08-29 DIAGNOSIS — B9689 Other specified bacterial agents as the cause of diseases classified elsewhere: Secondary | ICD-10-CM | POA: Diagnosis not present

## 2021-08-29 LAB — WET PREP, GENITAL
Sperm: NONE SEEN
Trich, Wet Prep: NONE SEEN
WBC, Wet Prep HPF POC: <10 — AB (ref <10)

## 2021-08-29 LAB — URINALYSIS, COMPLETE (UACMP) WITH MICROSCOPIC
Bilirubin Urine: NEGATIVE
Glucose, UA: NEGATIVE mg/dL
Ketones, ur: NEGATIVE mg/dL
Leukocytes,Ua: NEGATIVE
Nitrite: NEGATIVE
Protein, ur: NEGATIVE mg/dL
Specific Gravity, Urine: >1.03 — ABNORMAL HIGH (ref 1.005–1.030)
pH: 5 (ref 5.0–8.0)

## 2021-08-29 LAB — PREGNANCY, URINE: Preg Test, Ur: NEGATIVE

## 2021-08-29 MED ORDER — METRONIDAZOLE 500 MG PO TABS: 500.0000 mg | ORAL_TABLET | Freq: Two times a day (BID) | ORAL | 0 refills | Status: DC

## 2021-08-29 MED ORDER — FLUCONAZOLE 200 MG PO TABS
200.0000 mg | ORAL_TABLET | Freq: Every day | ORAL | 1 refills | Status: AC
Start: 2021-08-29 — End: 2021-08-31

## 2021-08-29 NOTE — Discharge Instructions (Addendum)
Take the Flagyl (metronidazole) 500 mg twice daily for treatment of your bacterial vaginosis.  Avoid alcohol while on the metronidazole as taken together will cause of vomiting.  Bacterial vaginosis is often caused by a imbalance of bacteria in your vaginal vault.  This is sometimes a result of using tampons or hormonal fluctuations during her menstrual cycle.  You if your symptoms are recurrent you can try using a boric acid suppository twice weekly to help maintain the acid-base balance in your vagina vault which could prevent further infection.  You can also try vaginal probiotics to help return normal bacterial balance.   For your vaginal yeast infection please use the Diflucan.  Take 1 tablet now and repeat in 1 week if your symptoms still persist.

## 2021-08-29 NOTE — ED Provider Notes (Signed)
[facility]    CSN: [id number] Arrival date & time: 08/29/21  1859      History   Chief Complaint Chief Complaint  Patient presents with   Abdominal Pain    HPI Jessica Aguilar is a 36 y.o. female.   HPI  36 year old female here for evaluation of abdominal cramping.  Patient reports that she has been experiencing cramping in her lower abdomen for the past 3 days.  She states these feel like period cramps but she has not had any bleeding.  These are also different than her normal period cramps and she reports her normal period cramps radiate up to her abdomen, around her back, and down to her mid thigh.  Her last menstrual cycle was 06/21/2021.  She did adjust her birth control to delay the onset of her menstrual cycle because she got married but she is still 8 weeks from her last cycle at least 4 weeks late.  She denies fever, nausea or vomiting, pain with urination or urinary urgency or frequency.  She does endorse that she had some vaginal discharge earlier in the week and some vaginal itching but those have both resolved.  She is also not having any pain at present.  She has taken pregnancy test at home which was negative.  She does report that she has been eating emotional lately which is uncharacteristic of her.  She denies any breast tenderness.  History reviewed. No pertinent past medical history.  There are no problems to display for this patient.   Past Surgical History:  Procedure Laterality Date   DENTAL SURGERY Bilateral     OB History   No obstetric history on file.      Home Medications    Prior to Admission medications   Medication Sig Start Date End Date Taking? Authorizing Provider  fluconazole (DIFLUCAN) 200 MG tablet Take 1 tablet (200 mg total) by mouth daily for 2 doses. 08/29/21 08/31/21 Yes Margarette Canada, NP  metroNIDAZOLE (FLAGYL) 500 MG tablet Take 1 tablet (500 mg total) by mouth 2 (two) times daily. 08/29/21  Yes Margarette Canada, NP  azelastine  (OPTIVAR) 0.05 % ophthalmic solution Place 1 drop into both eyes 4 (four) times daily. 07/24/21   Cuthriell, Charline Bills, PA-C  cetirizine (ZYRTEC) 10 MG tablet Take 1 tablet (10 mg total) by mouth daily. 07/24/21   Cuthriell, Charline Bills, PA-C  fluticasone (FLONASE) 50 MCG/ACT nasal spray Place 1 spray into both nostrils 2 (two) times daily. 07/24/21   Cuthriell, Charline Bills, PA-C    Family History History reviewed. No pertinent family history.  Social History Social History   Tobacco Use   Smoking status: Never   Smokeless tobacco: Never  Vaping Use   Vaping Use: Never used  Substance Use Topics   Alcohol use: Yes    Alcohol/week: 1.0 standard drink    Types: 1 Glasses of wine per week   Drug use: Never     Allergies   Amoxicillin   Review of Systems Review of Systems  Constitutional:  Negative for activity change, appetite change and fever.  Gastrointestinal:  Positive for abdominal pain. Negative for diarrhea, nausea and vomiting.  Genitourinary:  Positive for menstrual problem and vaginal discharge. Negative for dysuria, flank pain, frequency, hematuria, pelvic pain and vaginal bleeding.  Musculoskeletal:  Negative for back pain.  Skin:  Negative for rash.  Hematological: Negative.   Psychiatric/Behavioral: Negative.      Physical Exam Triage Vital Signs ED Triage Vitals [08/29/21 1909]  Enc Vitals Group     BP      Pulse      Resp      Temp      Temp src      SpO2      Weight 195 lb (88.5 kg)     Height 5\' 7"  (1.702 m)     Head Circumference      Peak Flow      Pain Score 8     Pain Loc      Pain Edu?      Excl. in [place]?    No data found.  Updated Vital Signs BP 116/83 (BP Location: Left Arm)    Pulse 86    Temp 98.1 F (36.7 C) (Oral)    Resp 16    Ht 5\' 7"  (1.702 m)    Wt 195 lb (88.5 kg)    LMP 06/21/2021    BMI 30.54 kg/m   Visual Acuity Right Eye Distance:   Left Eye Distance:   Bilateral Distance:    Right Eye Near:   Left Eye Near:     Bilateral Near:     Physical Exam Vitals and nursing note reviewed.  Constitutional:      General: She is not in acute distress.    Appearance: Normal appearance. She is not ill-appearing.  HENT:     Head: Normocephalic and atraumatic.  Cardiovascular:     Rate and Rhythm: Normal rate and regular rhythm.     Pulses: Normal pulses.     Heart sounds: Normal heart sounds. No murmur heard.   No friction rub. No gallop.  Pulmonary:     Effort: Pulmonary effort is normal.     Breath sounds: Normal breath sounds. No wheezing, rhonchi or rales.  Abdominal:     General: Abdomen is flat.     Palpations: Abdomen is soft.     Tenderness: There is abdominal tenderness. There is no right CVA tenderness, left CVA tenderness, guarding or rebound.  Skin:    General: Skin is warm and dry.     Capillary Refill: Capillary refill takes less than 2 seconds.     Findings: No erythema or rash.  Neurological:     General: No focal deficit present.     Mental Status: She is alert and oriented to person, place, and time.  Psychiatric:        Mood and Affect: Mood normal.        Behavior: Behavior normal.        Thought Content: Thought content normal.        Judgment: Judgment normal.     UC Treatments / Results  Labs (all labs ordered are listed, but only abnormal results are displayed) Labs Reviewed  WET PREP, GENITAL - Abnormal; Notable for the following components:      Result Value   Yeast Wet Prep HPF POC PRESENT (*)    Clue Cells Wet Prep HPF POC PRESENT (*)    WBC, Wet Prep HPF POC <10 (*)    All other components within normal limits  URINALYSIS, COMPLETE (UACMP) WITH MICROSCOPIC - Abnormal; Notable for the following components:   Specific Gravity, Urine >1.030 (*)    Hgb urine dipstick TRACE (*)    Bacteria, UA FEW (*)    All other components within normal limits  PREGNANCY, URINE    EKG   Radiology No results found.  Procedures Procedures (including critical care  time)  Medications Ordered in UC  Medications - No data to display  Initial Impression / Assessment and Plan / UC Course  I have reviewed the triage vital signs and the nursing notes.  Pertinent labs & imaging results that were available during my care of the patient were reviewed by me and considered in my medical decision making (see chart for details).  Patient is a nontoxic-appearing 34 old female here for evaluation of lower abdominal cramping that is been present for the past 3 days.  As outlined HPI above, she reports these cramps feel like her period cramps but they are not a typical pattern for her.  Cramps.  She also denies any bleeding.  Her last normal menstrual cycle was 06/21/2021.  Patient's physical exam reveals a benign cardiopulmonary exam with clear lung sounds in all fields.  No CVA tenderness on exam.  Abdomen is soft with tenderness in left and right lower quadrant.  Suprapubic discomfort is elicited on palpation.  No guarding or rebound.  Patient is here tonight because her new husband and her partner both insist that she be seen tonight.  She has had the abdominal cramping for the last 3 days and is currently not experiencing cramping at this present time.  Urinalysis was collected at triage and will order UA and urine pregnancy.  Will also check wet prep.  Urine pregnancy test is negative.  Will check beta hCG.  Urinalysis shows trace hemoglobin, 16 squamous epithelials, and few bacteria.  No leukocyte esterase, nitrites, or protein.  Wet prep shows yeast and clue cells.  We will treat patient for bacterial vaginosis and vaginal yeast infection.  We will treat with metronidazole and Diflucan.   Final Clinical Impressions(s) / UC Diagnoses   Final diagnoses:  BV (bacterial vaginosis)  Vaginal yeast infection     Discharge Instructions      Take the Flagyl (metronidazole) 500 mg twice daily for treatment of your bacterial vaginosis.  Avoid alcohol while on the  metronidazole as taken together will cause of vomiting.  Bacterial vaginosis is often caused by a imbalance of bacteria in your vaginal vault.  This is sometimes a result of using tampons or hormonal fluctuations during her menstrual cycle.  You if your symptoms are recurrent you can try using a boric acid suppository twice weekly to help maintain the acid-base balance in your vagina vault which could prevent further infection.  You can also try vaginal probiotics to help return normal bacterial balance.   For your vaginal yeast infection please use the Diflucan.  Take 1 tablet now and repeat in 1 week if your symptoms still persist.     ED Prescriptions     Medication Sig Dispense Auth. Provider   metroNIDAZOLE (FLAGYL) 500 MG tablet Take 1 tablet (500 mg total) by mouth 2 (two) times daily. 14 tablet Margarette Canada, NP   fluconazole (DIFLUCAN) 200 MG tablet Take 1 tablet (200 mg total) by mouth daily for 2 doses. 2 tablet Margarette Canada, NP      PDMP not reviewed this encounter.   Margarette Canada, NP 08/29/21 1953

## 2021-08-29 NOTE — ED Triage Notes (Signed)
Pt c/o abdominal cramping for 3 days, feels like period cramps but no bleeding . Pt is worried about ectopic pregnancy. Pregnancy were negative.

## 2021-08-30 DIAGNOSIS — R0602 Shortness of breath: Secondary | ICD-10-CM | POA: Diagnosis not present

## 2021-08-30 DIAGNOSIS — R002 Palpitations: Secondary | ICD-10-CM | POA: Diagnosis not present

## 2021-09-01 ENCOUNTER — Other Ambulatory Visit: Payer: Self-pay

## 2021-09-01 ENCOUNTER — Emergency Department: Payer: 59

## 2021-09-01 ENCOUNTER — Emergency Department
Admission: EM | Admit: 2021-09-01 | Discharge: 2021-09-01 | Disposition: A | Discharge status: Home / Self Care | Payer: 59 | Attending: Emergency Medicine | Admitting: Emergency Medicine

## 2021-09-01 DIAGNOSIS — R102 Pelvic and perineal pain: Secondary | ICD-10-CM | POA: Insufficient documentation

## 2021-09-01 DIAGNOSIS — R1030 Lower abdominal pain, unspecified: Secondary | ICD-10-CM | POA: Diagnosis not present

## 2021-09-01 LAB — WET PREP, GENITAL
Clue Cells Wet Prep HPF POC: NONE SEEN
Sperm: NONE SEEN
Trich, Wet Prep: NONE SEEN
WBC, Wet Prep HPF POC: <10 (ref <10)
Yeast Wet Prep HPF POC: NONE SEEN

## 2021-09-01 LAB — URINALYSIS, ROUTINE W REFLEX MICROSCOPIC
Bilirubin Urine: NEGATIVE
Glucose, UA: NEGATIVE mg/dL
Ketones, ur: NEGATIVE mg/dL
Nitrite: NEGATIVE
Protein, ur: NEGATIVE mg/dL
Specific Gravity, Urine: 1.018 (ref 1.005–1.030)
pH: 6 (ref 5.0–8.0)

## 2021-09-01 LAB — CHLAMYDIA/NGC RT PCR (ARMC ONLY)
Chlamydia Tr: NOT DETECTED
N gonorrhoeae: NOT DETECTED

## 2021-09-01 LAB — POC URINE PREG, ED: Preg Test, Ur: NEGATIVE

## 2021-09-01 LAB — CBC
HCT: 44.1 % (ref 36.0–46.0)
Hemoglobin: 14.8 g/dL (ref 12.0–15.0)
MCH: 27.8 pg (ref 26.0–34.0)
MCHC: 33.6 g/dL (ref 30.0–36.0)
MCV: 82.7 fL (ref 80.0–100.0)
Platelets: 305 10*3/uL (ref 150–400)
RBC: 5.33 MIL/uL — ABNORMAL HIGH (ref 3.87–5.11)
RDW: 12.9 % (ref 11.5–15.5)
WBC: 7.4 10*3/uL (ref 4.0–10.5)
nRBC: 0 % (ref 0.0–0.2)

## 2021-09-01 LAB — COMPREHENSIVE METABOLIC PANEL
ALT: 24 U/L (ref 0–44)
AST: 26 U/L (ref 15–41)
Albumin: 4.1 g/dL (ref 3.5–5.0)
Alkaline Phosphatase: 56 U/L (ref 38–126)
Anion gap: 11 (ref 5–15)
BUN: 9 mg/dL (ref 6–20)
CO2: 23 mmol/L (ref 22–32)
Calcium: 9 mg/dL (ref 8.9–10.3)
Chloride: 104 mmol/L (ref 98–111)
Creatinine, Ser: 0.66 mg/dL (ref 0.44–1.00)
GFR, Estimated: >60 mL/min (ref >60)
Glucose, Bld: 98 mg/dL (ref 70–99)
Potassium: 3.6 mmol/L (ref 3.5–5.1)
Sodium: 138 mmol/L (ref 135–145)
Total Bilirubin: 1.2 mg/dL (ref 0.3–1.2)
Total Protein: 7.7 g/dL (ref 6.5–8.1)

## 2021-09-01 LAB — LIPASE, BLOOD: Lipase: 31 U/L (ref 11–51)

## 2021-09-01 MED ORDER — MORPHINE SULFATE (PF) 4 MG/ML IV SOLN
4.0000 mg | Freq: Once | INTRAVENOUS | Status: DC
  Filled 2021-09-01: qty 1

## 2021-09-01 MED ORDER — IBUPROFEN 400 MG PO TABS
400.0000 mg | ORAL_TABLET | Freq: Once | ORAL | Status: AC | PRN
  Administered 2021-09-01: 400 mg via ORAL
  Filled 2021-09-01: qty 1

## 2021-09-01 NOTE — ED Provider Notes (Signed)
[facility] Provider Note    Event Date/Time   First MD Initiated Contact with Patient 09/01/21 1444     (approximate)   History   Abdominal Pain   HPI  Jessica Aguilar is a 36 y.o. female without significant past medical history presents accompanied by partner for assessment of some acute worsening of some lower crampy abdominal pain today.  Patient was seen in urgent care on 1/9 after experiencing 3 days of lower crampy abdominal pain.  She was diagnosed with trichomoniasis and yeast infection and started on Flagyl and Diflucan.  She states she had been feeling better until pain suddenly worsened today.  She thinks her last menstrual period was in November.  She denies any fevers, chills, chest pain, cough, shortness of breath, headache, earache, sore throat, vomiting, diarrhea, constipation, back pain or upper abdominal pain.  No recent falls or injuries.  She did take some ibuprofen but is not sure if this helped or not.  She states it does not feel like her typical menstrual pain.  She denies any burning with urination, blood in her urine or other abnormal discharge of the last couple days.      Physical Exam  Triage Vital Signs: ED Triage Vitals  Enc Vitals Group     BP 09/01/21 1335 (!) 124/98     Pulse Rate 09/01/21 1335 93     Resp 09/01/21 1335 16     Temp 09/01/21 1335 98.4 F (36.9 C)     Temp src --      SpO2 09/01/21 1335 96 %     Weight --      Height 09/01/21 1331 5\' 7"  (1.702 m)     Head Circumference --      Peak Flow --      Pain Score 09/01/21 1331 10     Pain Loc --      Pain Edu? --      Excl. in GC? --     Most recent vital signs: Vitals:   09/01/21 1335 09/01/21 1651  BP: (!) 124/98 125/85  Pulse: 93 89  Resp: 16 18  Temp: 98.4 F (36.9 C)   SpO2: 96% 96%    General: Awake, appears mildly uncomfortable. CV:  Good peripheral perfusion.  2+ radial pulses.  No murmurs rubs or gallops. Resp:  Normal effort.  Clear  bilaterally Abd:  No distention.  Mild tenderness of the bilateral lower abdomen.  No significant right upper quadrant epigastric or left upper quadrant tenderness.  No CVA tenderness Other:  Pelvic exam is remarkable for closed off with some scant bleeding.  There is no friability, other lesions, significant cervical motion tenderness or other acute process noted.   ED Results / Procedures / Treatments  Labs (all labs ordered are listed, but only abnormal results are displayed) Labs Reviewed  CBC - Abnormal; Notable for the following components:      Result Value   RBC 5.33 (*)    All other components within normal limits  URINALYSIS, ROUTINE W REFLEX MICROSCOPIC - Abnormal; Notable for the following components:   Color, Urine YELLOW (*)    APPearance CLOUDY (*)    Hgb urine dipstick MODERATE (*)    Leukocytes,Ua SMALL (*)    Bacteria, UA FEW (*)    All other components within normal limits  URINE CULTURE  WET PREP, GENITAL  CHLAMYDIA/NGC RT PCR ([facility] ONLY)            LIPASE,  BLOOD  COMPREHENSIVE METABOLIC PANEL  POC URINE PREG, ED     EKG    RADIOLOGY Pelvic ultrasound reviewed and ordered by myself remarkable for no evidence of torsion, cyst, free fluid or other acute process.  Endometrium is unremarkable without any masses.  No fibroids.  There is normal dominant follicle noted on the right without any other large cysts and left ovary is unremarkable any masses.  Also reviewed radiology interpretation and agree with the findings.   PROCEDURES:  Critical Care performed:    MEDICATIONS ORDERED IN ED: Medications  morphine 4 MG/ML injection 4 mg (0 mg Intravenous Hold 09/01/21 1524)  ibuprofen (ADVIL) tablet 400 mg (400 mg Oral Given 09/01/21 1337)     IMPRESSION / MDM / ASSESSMENT AND PLAN / ED COURSE  I reviewed the triage vital signs and the nursing notes.                              Differential diagnosis includes, but is not limited to cystitis, PID,  torsion, pregnancy, symptomatic ovarian cyst, appendicitis, kidney stone, diverticulitis  CMP shows no significant electrolyte or metabolic derangements.  There is no evidence of hepatitis or cholestasis.  Lipase at 31 not consistent with acute pancreatitis.  Pregnancy test is negative.  CBC without leukocytosis or acute anemia.  UA is markable for moderate hemoglobin and small leukocyte esterase with few bacteria noted as well as 2 and 1-50 squamous epithelial cells concerning for contamination.  6-10 WBCs.  Pelvic ultrasound reviewed and ordered by myself remarkable for no evidence of torsion, cyst, free fluid or other acute process.  Endometrium is unremarkable without any masses.  No fibroids.  There is normal dominant follicle noted on the right without any other large cysts and left ovary is unremarkable any masses.  Also reviewed radiology interpretation and agree with the findings.  No evidence on pelvic exam PID other some scant bleeding from a closed os.  Patient states she started her menstrual period while getting ultrasound.  Wet prep and GC studies were collected.  Patient states she does not feel like her pain is much better.  She also noted earlier that does not feel like her typical menstrual pain.  Advised patient that I cannot rule out other life-threatening processes such as appendicitis without a CT given otherwise no clear explanation for symptoms on ultrasound or exam and I recommended at minimum she wait for her wet prep and gonorrhea chlamydia studies but she is adamant that she can leave before these result.  She states she understands that undiagnosed appendicitis can be life-threatening but she still wishes to leave against my advice.  I think she is capacity to make this decision.  She was discharged AMA instructed to follow-up her wet prep and GC studies with her PCP and to return if she changes her mind or experiences any new or worsening of symptoms.  Discharged AMA.      FINAL CLINICAL IMPRESSION(S) / ED DIAGNOSES   Final diagnoses:  Pelvic pain     Rx / DC Orders   ED Discharge Orders     None        Note:  This document was prepared using Dragon voice recognition software and may include unintentional dictation errors.   Gilles Chiquito, MD 09/01/21 [phone]

## 2021-09-01 NOTE — ED Notes (Signed)
Pt wants to hold off on the morphine for now.  Feels the ibuprofen was effective enough for her pain

## 2021-09-01 NOTE — ED Notes (Addendum)
D/C and reasons to return to ED discussed with pt, pt verbalized understanding. Pt ambulatory with steady gait on D/C. NAD noted.  Pt educated on STI tests waiting to be resulted.

## 2021-09-01 NOTE — ED Triage Notes (Signed)
Pt to ER via POV with complaints of lower abdominal cramping that started 1/6. Reports she was seen at urgent care and diagnosed with BV/ yeast infection. Reports pain is worsening. States she was studying when the pain returned, states it was sharp and sudden.   LMP November.

## 2021-09-02 ENCOUNTER — Encounter: Payer: Self-pay | Admitting: Family Medicine

## 2021-09-02 LAB — URINE CULTURE

## 2021-09-06 DIAGNOSIS — R002 Palpitations: Secondary | ICD-10-CM | POA: Diagnosis not present

## 2021-09-06 DIAGNOSIS — R0602 Shortness of breath: Secondary | ICD-10-CM | POA: Diagnosis not present

## 2021-09-19 DIAGNOSIS — R002 Palpitations: Secondary | ICD-10-CM | POA: Diagnosis not present

## 2021-09-19 DIAGNOSIS — R0602 Shortness of breath: Secondary | ICD-10-CM | POA: Diagnosis not present

## 2021-09-29 ENCOUNTER — Encounter: Payer: Commercial Managed Care - PPO | Admitting: Obstetrics

## 2021-09-29 DIAGNOSIS — N926 Irregular menstruation, unspecified: Secondary | ICD-10-CM

## 2021-10-06 ENCOUNTER — Ambulatory Visit (INDEPENDENT_AMBULATORY_CARE_PROVIDER_SITE_OTHER): Payer: 59 | Admitting: Obstetrics

## 2021-10-06 ENCOUNTER — Encounter: Payer: Self-pay | Admitting: Obstetrics

## 2021-10-06 ENCOUNTER — Other Ambulatory Visit: Payer: Self-pay

## 2021-10-06 VITALS — BP 137/84 | HR 97 | Ht 67.0 in | Wt 194.4 lb

## 2021-10-06 DIAGNOSIS — N926 Irregular menstruation, unspecified: Secondary | ICD-10-CM | POA: Diagnosis not present

## 2021-10-06 NOTE — Progress Notes (Signed)
GYN VISIT  SUBJECTIVE  Jessica Aguilar is a 36 y.o. female who presents today for irregular periods. She initially scheduled the appointment for a missed period, but her period started 10/03/21. She went off Depo in November and did not have a period until 09/03/21. She is not currently on any contraception. She is concerned about the side effects of medication and wants to see how her cycles self-regulate. She does not currently desire a pregnancy but is considering it in the next year.  OBJECTIVE Vitals:   10/06/21 1353  BP: 137/84  Pulse: 97    Last Weight  Most recent update: 10/06/2021  1:58 PM    Weight  88.2 kg (194 lb 6.4 oz)            Body mass index is 30.45 kg/m.  Declines exam today.  ASSESSMENT/PLAN Discussed that it may take several months to a year for her cycle to regulate after stopping Depo. Discussed precautions to avoid pregnancy. Declines STI testing today. She would like a Pap and physical exam, but she does not want to do this today and will schedule it at a different time.  Lloyd Huger, CNM

## 2021-10-13 ENCOUNTER — Ambulatory Visit (INDEPENDENT_AMBULATORY_CARE_PROVIDER_SITE_OTHER): Payer: 59 | Admitting: Obstetrics

## 2021-10-13 ENCOUNTER — Other Ambulatory Visit (HOSPITAL_COMMUNITY)
Admission: RE | Admit: 2021-10-13 | Discharge: 2021-10-13 | Disposition: A | Discharge status: Home / Self Care | Payer: 59 | Source: Ambulatory Visit | Attending: Obstetrics | Admitting: Obstetrics

## 2021-10-13 ENCOUNTER — Other Ambulatory Visit: Payer: Self-pay

## 2021-10-13 ENCOUNTER — Encounter: Payer: Self-pay | Admitting: Obstetrics

## 2021-10-13 VITALS — BP 114/81 | HR 94 | Ht 67.0 in | Wt 194.7 lb

## 2021-10-13 DIAGNOSIS — Z124 Encounter for screening for malignant neoplasm of cervix: Secondary | ICD-10-CM

## 2021-10-13 DIAGNOSIS — F909 Attention-deficit hyperactivity disorder, unspecified type: Secondary | ICD-10-CM | POA: Diagnosis not present

## 2021-10-13 DIAGNOSIS — R69 Illness, unspecified: Secondary | ICD-10-CM | POA: Diagnosis not present

## 2021-10-13 NOTE — Progress Notes (Signed)
SUBJECTIVE  HPI  Jessica Aguilar is a 36 y.o.-year-old female who presents for an annual physical and gynecological exam today. She would like a Pap smear today and to discuss her health and weight loss. She also needs a referral to a mental health care provider to prescribe and manage her ADHD medications. At her previous appointment, we discussed the changes to her menstrual cycle since stopping Depo. She is also concerned about possible endometriosis because it runs in her family. She has somewhat painful periods but does not describe it as very intense and it does not affect her day-to-day functioning. She would like to attempt a pregnancy in a year or so.   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      GYN/Menstrual History Patient's last menstrual period was 10/03/2021. Irregular, recently off Depo Provera Last Pap: unsure Contraception: condoms, withdrawal  Prevention Dentist: sees regularly Eye exam: recommended Mammogram: at 42   Current Medications Outpatient Medications Prior to Visit  Medication Sig   azelastine (OPTIVAR) 0.05 % ophthalmic solution Place 1 drop into both eyes 4 (four) times daily.   cetirizine (ZYRTEC) 10 MG tablet Take 1 tablet (10 mg total) by mouth daily.   fluticasone (FLONASE) 50 MCG/ACT nasal spray Place 1 spray into both nostrils 2 (two) times daily.   metroNIDAZOLE (FLAGYL) 500 MG tablet Take 1 tablet (500 mg total) by mouth 2 (two) times daily.    sertraline (ZOLOFT) 25 MG tablet Take 25 mg by mouth daily. Breaks pill in half and takes 1/2 tablet daily.   Facility-Administered Medications Prior to Visit  Medication Dose Route Frequency Provider   medroxyPROGESTERone (DEPO-PROVERA) injection 150 mg  150 mg Intramuscular Q90 days Junious Dresser, FNP      The pregnancy intention screening data noted above was reviewed. Potential methods of contraception were discussed. The patient elected to proceed with No data recorded.   ROS History obtained from the patient General ROS: negative for - chills, fatigue, or night sweats Psychological ROS: positive for - anxiety and ADHD Ophthalmic ROS: negative for - blurry vision or decreased vision Hematological and Lymphatic ROS: negative for - bleeding problems or swollen lymph nodes Endocrine ROS: negative for - palpitations or polydipsia/polyuria Breast ROS: negative for breast lumps Respiratory ROS: no cough, shortness of breath, or wheezing Cardiovascular ROS: no chest pain or dyspnea on exertion Gastrointestinal ROS: no abdominal pain, change in bowel habits, or black or bloody stools Genito-Urinary ROS: no dysuria, trouble voiding, or hematuria negative for - genital discharge or vulvar/vaginal symptoms Musculoskeletal ROS: negative Neurological ROS: negative  Depression screen [facility] 2/9 12/31/2020  Decreased Interest 0  Down, Depressed, Hopeless 1  PHQ - 2 Score 1  Altered sleeping 0  Tired, decreased energy 1  Change in appetite 0  Feeling bad or failure about yourself  1  Trouble concentrating 0  Moving slowly or fidgety/restless 0  Suicidal thoughts 0  PHQ-9 Score 3     OBJECTIVE  Last Weight  Most recent update: 10/13/2021  2:04 PM    Weight  88.3 kg (194 lb 11.2 oz)             Body mass index is 30.49 kg/m.    BP 114/81    Pulse 94    Ht 5\' 7"  (1.702 m)    Wt 194 lb 11.2 oz (88.3 kg)    LMP 10/03/2021    BMI 30.49 kg/m  General appearance: alert,  cooperative, and appears stated age Head: Normocephalic, without obvious abnormality, atraumatic Eyes: negative findings: lids and lashes normal and conjunctivae and sclerae normal Neck: no adenopathy, supple, symmetrical, trachea midline, and thyroid not enlarged, symmetric, no tenderness/mass/nodules Back: symmetric, no curvature. ROM normal. No CVA tenderness. Lungs: clear to auscultation bilaterally Breasts: normal appearance, no masses or tenderness, No axillary or supraclavicular adenopathy, Normal to palpation without dominant masses Heart: regular rate and rhythm, S1, S2 normal, no murmur, click, rub or gallop Abdomen: soft, non-tender; bowel sounds normal; no masses,  no organomegaly Pelvic: cervix normal in appearance, external genitalia normal, and vagina normal without discharge. Pap collected. Extremities: extremities normal, atraumatic, no cyanosis or edema Skin: Skin color, texture, turgor normal. No rashes or lesions Lymph nodes: Cervical, supraclavicular, and axillary nodes normal.  ASSESSMENT  1) Annual exam 2) Pap due 3) Concerns about potential endometriosis and preconception health 4) Interested in weight loss  PLAN 1) Physical exam as noted. Declines lab work and STI screening. 2) Pap collected. F/u based on results. 3) Discussed definitive diagnosis of endometriosis can only be done with laparoscopic surgery and is typically treated based on clinical presentation. Since she does not show any typical s/s of endometriosis, treatment and surgical diagnosis would not be recommended. Recommended that if she has difficulty conceiving when she begin attempting pregnancy, we could revisit the issue. Reviewed her other potential health concerns regarding a future pregnancy. There does not appear to be anything about her medical history at this time that would be concerning for future ability to conceive. 4) Recommended adding in regular exercise and a balanced diet. May schedule  a weight loss visit with Philip Aspen, CNM if desired.  Return in one year for annual visit or PRN with concerns.   Lloyd Huger, CNM

## 2021-10-18 ENCOUNTER — Encounter: Payer: Self-pay | Admitting: Obstetrics

## 2021-10-18 LAB — CYTOLOGY - PAP
Adequacy: ABSENT
Comment: NEGATIVE
Diagnosis: NEGATIVE
High risk HPV: NEGATIVE

## 2021-11-01 ENCOUNTER — Other Ambulatory Visit: Payer: Self-pay

## 2021-11-01 ENCOUNTER — Encounter: Payer: Self-pay | Admitting: Certified Nurse Midwife

## 2021-11-01 ENCOUNTER — Ambulatory Visit (INDEPENDENT_AMBULATORY_CARE_PROVIDER_SITE_OTHER): Payer: 59 | Admitting: Certified Nurse Midwife

## 2021-11-01 VITALS — BP 118/81 | HR 85 | Ht 67.0 in | Wt 195.2 lb

## 2021-11-01 DIAGNOSIS — Z713 Dietary counseling and surveillance: Secondary | ICD-10-CM | POA: Diagnosis not present

## 2021-11-01 DIAGNOSIS — E669 Obesity, unspecified: Secondary | ICD-10-CM | POA: Diagnosis not present

## 2021-11-01 MED ORDER — CYANOCOBALAMIN 1000 MCG/ML IJ SOLN
1000.0000 ug | Freq: Once | INTRAMUSCULAR | Status: AC
  Administered 2021-11-01: 1000 ug via INTRAMUSCULAR

## 2021-11-01 MED ORDER — CYANOCOBALAMIN 1000 MCG/ML IJ SOLN: 1000.0000 ug | Freq: Once | INTRAMUSCULAR | 5 refills | Status: AC

## 2021-11-01 MED ORDER — PHENTERMINE HCL 37.5 MG PO TABS: 37.5000 mg | ORAL_TABLET | Freq: Every day | ORAL | 0 refills | Status: DC

## 2021-11-01 NOTE — Progress Notes (Signed)
0

## 2021-11-01 NOTE — Patient Instructions (Signed)
Calorie Counting for Weight Loss ?Calories are units of energy. Your body needs a certain number of calories from food to keep going throughout the day. When you eat or drink more calories than your body needs, your body stores the extra calories mostly as fat. When you eat or drink fewer calories than your body needs, your body burns fat to get the energy it needs. ?Calorie counting means keeping track of how many calories you eat and drink each day. Calorie counting can be helpful if you need to lose weight. If you eat fewer calories than your body needs, you should lose weight. Ask your health care provider what a healthy weight is for you. ?For calorie counting to work, you will need to eat the right number of calories each day to lose a healthy amount of weight per week. A dietitian can help you figure out how many calories you need in a day and will suggest ways to reach your calorie goal. ?A healthy amount of weight to lose each week is usually 1-2 lb (0.5-0.9 kg). This usually means that your daily calorie intake should be reduced by 500-750 calories. ?Eating 1,200-1,500 calories a day can help most women lose weight. ?Eating 1,500-1,800 calories a day can help most men lose weight. ?What do I need to know about calorie counting? ?Work with your health care provider or dietitian to determine how many calories you should get each day. To meet your daily calorie goal, you will need to: ?Find out how many calories are in each food that you would like to eat. Try to do this before you eat. ?Decide how much of the food you plan to eat. ?Keep a food log. Do this by writing down what you ate and how many calories it had. ?To successfully lose weight, it is important to balance calorie counting with a healthy lifestyle that includes regular activity. ?Where do I find calorie information? ?The number of calories in a food can be found on a Nutrition Facts label. If a food does not have a Nutrition Facts label, try to  look up the calories online or ask your dietitian for help. ?Remember that calories are listed per serving. If you choose to have more than one serving of a food, you will have to multiply the calories per serving by the number of servings you plan to eat. For example, the label on a package of bread might say that a serving size is 1 slice and that there are 90 calories in a serving. If you eat 1 slice, you will have eaten 90 calories. If you eat 2 slices, you will have eaten 180 calories. ?How do I keep a food log? ?After each time that you eat, record the following in your food log as soon as possible: ?What you ate. Be sure to include toppings, sauces, and other extras on the food. ?How much you ate. This can be measured in cups, ounces, or number of items. ?How many calories were in each food and drink. ?The total number of calories in the food you ate. ?Keep your food log near you, such as in a pocket-sized notebook or on an app or website on your mobile phone. Some programs will calculate calories for you and show you how many calories you have left to meet your daily goal. ?What are some portion-control tips? ?Know how many calories are in a serving. This will help you know how many servings you can have of a certain food. ?  Use a measuring cup to measure serving sizes. You could also try weighing out portions on a kitchen scale. With time, you will be able to estimate serving sizes for some foods. ?Take time to put servings of different foods on your favorite plates or in your favorite bowls and cups so you know what a serving looks like. ?Try not to eat straight from a food's packaging, such as from a bag or box. Eating straight from the package makes it hard to see how much you are eating and can lead to overeating. Put the amount you would like to eat in a cup or on a plate to make sure you are eating the right portion. ?Use smaller plates, glasses, and bowls for smaller portions and to prevent  overeating. ?Try not to multitask. For example, avoid watching TV or using your computer while eating. If it is time to eat, sit down at a table and enjoy your food. This will help you recognize when you are full. It will also help you be more mindful of what and how much you are eating. ?What are tips for following this plan? ?Reading food labels ?Check the calorie count compared with the serving size. The serving size may be smaller than what you are used to eating. ?Check the source of the calories. Try to choose foods that are high in protein, fiber, and vitamins, and low in saturated fat, trans fat, and sodium. ?Shopping ?Read nutrition labels while you shop. This will help you make healthy decisions about which foods to buy. ?Pay attention to nutrition labels for low-fat or fat-free foods. These foods sometimes have the same number of calories or more calories than the full-fat versions. They also often have added sugar, starch, or salt to make up for flavor that was removed with the fat. ?Make a grocery list of lower-calorie foods and stick to it. ?Cooking ?Try to cook your favorite foods in a healthier way. For example, try baking instead of frying. ?Use low-fat dairy products. ?Meal planning ?Use more fruits and vegetables. One-half of your plate should be fruits and vegetables. ?Include lean proteins, such as chicken, turkey, and fish. ?Lifestyle ?Each week, aim to do one of the following: ?150 minutes of moderate exercise, such as walking. ?75 minutes of vigorous exercise, such as running. ?General information ?Know how many calories are in the foods you eat most often. This will help you calculate calorie counts faster. ?Find a way of tracking calories that works for you. Get creative. Try different apps or programs if writing down calories does not work for you. ?What foods should I eat? ? ?Eat nutritious foods. It is better to have a nutritious, high-calorie food, such as an avocado, than a food with  few nutrients, such as a bag of potato chips. ?Use your calories on foods and drinks that will fill you up and will not leave you hungry soon after eating. ?Examples of foods that fill you up are nuts and nut butters, vegetables, lean proteins, and high-fiber foods such as whole grains. High-fiber foods are foods with more than 5 g of fiber per serving. ?Pay attention to calories in drinks. Low-calorie drinks include water and unsweetened drinks. ?The items listed above may not be a complete list of foods and beverages you can eat. Contact a dietitian for more information. ?What foods should I limit? ?Limit foods or drinks that are not good sources of vitamins, minerals, or protein or that are high in unhealthy fats. These include: ?  Candy. ?Other sweets. ?Sodas, specialty coffee drinks, alcohol, and juice. ?The items listed above may not be a complete list of foods and beverages you should avoid. Contact a dietitian for more information. ?How do I count calories when eating out? ?Pay attention to portions. Often, portions are much larger when eating out. Try these tips to keep portions smaller: ?Consider sharing a meal instead of getting your own. ?If you get your own meal, eat only half of it. Before you start eating, ask for a container and put half of your meal into it. ?When available, consider ordering smaller portions from the menu instead of full portions. ?Pay attention to your food and drink choices. Knowing the way food is cooked and what is included with the meal can help you eat fewer calories. ?If calories are listed on the menu, choose the lower-calorie options. ?Choose dishes that include vegetables, fruits, whole grains, low-fat dairy products, and lean proteins. ?Choose items that are boiled, broiled, grilled, or steamed. Avoid items that are buttered, battered, fried, or served with cream sauce. Items labeled as crispy are usually fried, unless stated otherwise. ?Choose water, low-fat milk,  unsweetened iced tea, or other drinks without added sugar. If you want an alcoholic beverage, choose a lower-calorie option, such as a glass of wine or light beer. ?Ask for dressings, sauces, and syrups on the side. T

## 2021-11-01 NOTE — Progress Notes (Signed)
Subjective:  ?Jessica Aguilar is a 36 y.o. No obstetric history on file. at Unknown being seen today for weight loss management- initial visit.  Patient reports General ROS: negative and reports previous weight loss attempts: none ? ?Onset was gradual over the past several years, feels like job and job stress is contributing factor. She works for [employer].  ?Onset followed: Job and stress. Lifestyle changes ?Associated symptoms include: fatigue, depression, anxiety,  change in clothing fit. ?Previous/ none has tried diet changes and exercise but has not been consistent. ? Pertinent medical history includes: , anxiety and depression.  ? Risk factors include: none ? The patient has a surgical history [id number] ?Pertinent social history includes: denies ? Today's evaluation has included: Comprehensive metabolic profile, hemoglobin A1c, thyroid panel  and Lipid panel.  ? ?The following portions of the patient's history were reviewed and updated as appropriate: allergies, current medications, past family history, past medical history, past social history, past surgical history and problem list.  ? ?Objective:  ? ?Vitals:  ? 11/01/21 1409  ?BP: 118/81  ?Pulse: 85  ?Weight: 195 lb 3.2 oz (88.5 kg)  ?Height: 5\' 7"  (1.702 m)  ?Waist 38.50 ? ?General:  Alert, oriented and cooperative. Patient is in no acute distress.  ?PE: Well groomed female in no current distress,   ?Mental Status: Normal mood and affect. Normal behavior. Normal judgment and thought content.  ? ?Current BMI: Body mass index is 30.57 kg/m?. ? ? ?Assessment and Plan:  ?Obesity ? ?1. Encounter for weight loss counseling ? ? 35 ?Refine Search ?Contact the [facility]Help [facility] ?Date of Birth: ?11-Feb-1986 ?Recent Address: ?View Linked Records (0) ?Other Tools/Metrics ?NarxCare ?Report generated on 11/01/2021. Report Date Range: 11/02/2019 - 11/01/2021 ?PDF Report ?Export ?Narx Scores ?No results available ?Explanation and Guidance ?Overdose Risk  Score ?000 ?(Range [phone]) ?Explanation and Guidance ?State Indicators (0) ? ? ?Plan: low carb, High protein diet ?RX for adipex 37.5 mg daily and B12 045-409.ml monthly, to start now with first injection given at today's visit. Reviewed side-effects common to both medications and expected outcomes. ?Increase daily water intake to at least 8 bottle a day, every day. ? ?Goal is to reduse weight by 5% by end of three months, (9/7lbs) and will re-evaluate then. ? ?RTC in 4 weeks forvisit to check weight & BP, and get next B12 injections. ? ? ? ?Please refer to After Visit Summary for other counseling recommendations.  ? ? ?06-21-1970, CNM ? ? ?Consider the Low Glycemic Index Diet and 6 smaller meals daily .  This boosts your metabolism and regulates your sugars: ? ? ?Use the protein bar by Atkins because they have lots of fiber in them ? ?Find the low carb flatbreads, tortillas and pita breads for sandwiches: ? ?Joseph's makes a pita bread and a flat bread , available at [facility] and [employer]; ?Toufayah makes a low carb flatbread available at [facility] and HT that is 9 net carbs and 100 cal ?Mission makes a low carb whole wheat tortilla available at [employer] stores with 6 net carbs and 210 cal ? ?Greek yogurt can still have a lot of carbs .  Dannon Light N fit has 80 cal and 8 carbs  ?  ?

## 2021-11-02 LAB — LIPID PANEL
Chol/HDL Ratio: 3.4 ratio (ref 0.0–4.4)
Cholesterol, Total: 150 mg/dL (ref 100–199)
HDL: 44 mg/dL (ref >39)
LDL Chol Calc (NIH): 82 mg/dL (ref 0–99)
Triglycerides: 139 mg/dL (ref 0–149)
VLDL Cholesterol Cal: 24 mg/dL (ref 5–40)

## 2021-11-02 LAB — COMPREHENSIVE METABOLIC PANEL
ALT: 23 IU/L (ref 0–32)
AST: 18 IU/L (ref 0–40)
Albumin/Globulin Ratio: 1.7 (ref 1.2–2.2)
Albumin: 4.5 g/dL (ref 3.8–4.8)
Alkaline Phosphatase: 73 IU/L (ref 44–121)
BUN/Creatinine Ratio: 14 (ref 9–23)
BUN: 10 mg/dL (ref 6–20)
Bilirubin Total: 1 mg/dL (ref 0.0–1.2)
CO2: 23 mmol/L (ref 20–29)
Calcium: 9.3 mg/dL (ref 8.7–10.2)
Chloride: 102 mmol/L (ref 96–106)
Creatinine, Ser: 0.72 mg/dL (ref 0.57–1.00)
Globulin, Total: 2.7 g/dL (ref 1.5–4.5)
Glucose: 77 mg/dL (ref 70–99)
Potassium: 4.9 mmol/L (ref 3.5–5.2)
Sodium: 140 mmol/L (ref 134–144)
Total Protein: 7.2 g/dL (ref 6.0–8.5)
eGFR: 113 mL/min/{1.73_m2} (ref >59)

## 2021-11-02 LAB — THYROID PANEL WITH TSH
Free Thyroxine Index: 1.5 (ref 1.2–4.9)
T3 Uptake Ratio: 23 % — ABNORMAL LOW (ref 24–39)
T4, Total: 6.5 ug/dL (ref 4.5–12.0)
TSH: 1.16 u[IU]/mL (ref 0.450–4.500)

## 2021-11-02 LAB — HEMOGLOBIN A1C
Est. average glucose Bld gHb Est-mCnc: 100 mg/dL
Hgb A1c MFr Bld: 5.1 % (ref 4.8–5.6)

## 2021-11-03 ENCOUNTER — Encounter: Payer: Self-pay | Admitting: Certified Nurse Midwife

## 2021-11-07 NOTE — Telephone Encounter (Signed)
Called patient to answer per pt request on My chart. No answer, message left. Pt encouraged to send questions via my chart or request another call tomorrow.  ? ?Philip Aspen, CNM  ?

## 2021-11-18 ENCOUNTER — Telehealth: Payer: Self-pay | Admitting: Certified Nurse Midwife

## 2021-11-18 NOTE — Telephone Encounter (Signed)
Pt would like to discuss RX , she has not taken the RX prescribed due to feeling uncertain about the side effects. Pt is asking to speak to [place]  ?

## 2021-11-21 ENCOUNTER — Telehealth: Payer: Self-pay | Admitting: Certified Nurse Midwife

## 2021-11-21 NOTE — Telephone Encounter (Signed)
Patient called and stated that she is wanting a call from a physician to discuss medication before she starts to take the weight loss RX phentermine. Patient want to discuss the poss side effects. She has concerns. Please advise. ?

## 2021-11-21 NOTE — Telephone Encounter (Signed)
Returned patients call, all questions answered.  ? ?Jessica Aguilar, CNM  ?

## 2021-12-06 ENCOUNTER — Ambulatory Visit (INDEPENDENT_AMBULATORY_CARE_PROVIDER_SITE_OTHER): Payer: 59 | Admitting: Certified Nurse Midwife

## 2021-12-06 ENCOUNTER — Encounter: Payer: Self-pay | Admitting: Certified Nurse Midwife

## 2021-12-06 VITALS — BP 110/76 | HR 83 | Ht 67.0 in | Wt 192.7 lb

## 2021-12-06 DIAGNOSIS — Z713 Dietary counseling and surveillance: Secondary | ICD-10-CM

## 2021-12-06 MED ORDER — "SYRINGE LUER SLIP 25G X 5/8"" 1 ML MISC": 1.0000 mL | 4 refills | Status: DC

## 2021-12-06 MED ORDER — CYANOCOBALAMIN 1000 MCG/ML IJ SOLN
1000.0000 ug | Freq: Once | INTRAMUSCULAR | Status: AC
  Administered 2021-12-06: 1000 ug via INTRAMUSCULAR

## 2021-12-06 MED ORDER — CYANOCOBALAMIN 1000 MCG/ML IJ SOLN: 1000.0000 ug | Freq: Once | INTRAMUSCULAR | 0 refills | Status: AC

## 2021-12-06 NOTE — Patient Instructions (Signed)

## 2021-12-06 NOTE — Progress Notes (Signed)
SUBJECTIVE:  36 y.o. here for follow-up weight loss visit, previously seen 4 weeks ago. Denies any concerns and has not started taking the mediation due to anxiety. She lost weight this past month with diet modifications, exorcising as her schedule would allow , and increasing her water intake. Discussed with pt to hold off on use of phentermine , that she did not need to take it as she is losing weight without it. She can revisit use if she gets to a point that she is at [employer] . She verbalizes and agrees to plan.  ? ?OBJECTIVE:  BP 110/76   Pulse 83   Ht 5\' 7"  (1.702 m)   Wt 192 lb 11.2 oz (87.4 kg)   BMI 30.18 kg/m?   Body mass index is 30.18 kg/m?Marland Kitchen Waist: 37.5 ? ?Patient appears well. ?ASSESSMENT:  Obesity- responding well to weight loss plan ?PLAN:  T. B12 1062mcg/ml injection given, pt will self inject.  ?RTC prn  ? ?Philip Aspen, CNM  ?

## 2022-01-04 ENCOUNTER — Encounter: Payer: 59 | Admitting: Certified Nurse Midwife

## 2022-01-06 ENCOUNTER — Encounter: Payer: 59 | Admitting: Certified Nurse Midwife

## 2022-03-13 ENCOUNTER — Ambulatory Visit: Payer: 59

## 2022-03-13 NOTE — Progress Notes (Deleted)
Patient presents for evaluation of amenorrhea. She believes she could be pregnant.  Current symptoms also include, Urine HCG: Briefly discussed pre-natal care options. She will schedule her appointments at check out.

## 2022-05-12 ENCOUNTER — Ambulatory Visit: Payer: 59

## 2022-05-31 DIAGNOSIS — R109 Unspecified abdominal pain: Secondary | ICD-10-CM | POA: Diagnosis not present

## 2022-06-17 DIAGNOSIS — F901 Attention-deficit hyperactivity disorder, predominantly hyperactive type: Secondary | ICD-10-CM | POA: Diagnosis not present

## 2022-06-17 DIAGNOSIS — R69 Illness, unspecified: Secondary | ICD-10-CM | POA: Diagnosis not present

## 2022-06-17 DIAGNOSIS — F39 Unspecified mood [affective] disorder: Secondary | ICD-10-CM | POA: Diagnosis not present

## 2022-06-22 ENCOUNTER — Telehealth: Payer: 59 | Admitting: Physician Assistant

## 2022-06-22 DIAGNOSIS — A084 Viral intestinal infection, unspecified: Secondary | ICD-10-CM | POA: Diagnosis not present

## 2022-06-22 MED ORDER — FAMOTIDINE 40 MG PO TABS
40.0000 mg | ORAL_TABLET | Freq: Every day | ORAL | 0 refills | Status: DC
Start: 2022-06-22 — End: 2023-02-23

## 2022-06-22 NOTE — Progress Notes (Signed)
Virtual Visit Consent   Jessica Aguilar, you are scheduled for a virtual visit with a  provider today. Just as with appointments in the office, your consent must be obtained to participate. Your consent will be active for this visit and any virtual visit you may have with one of our providers in the next 365 days. If you have a MyChart account, a copy of this consent can be sent to you electronically.  As this is a virtual visit, video technology does not allow for your provider to perform a traditional examination. This may limit your provider's ability to fully assess your condition. If your provider identifies any concerns that need to be evaluated in person or the need to arrange testing (such as labs, EKG, etc.), we will make arrangements to do so. Although advances in technology are sophisticated, we cannot ensure that it will always work on either your end or our end. If the connection with a video visit is poor, the visit may have to be switched to a telephone visit. With either a video or telephone visit, we are not always able to ensure that we have a secure connection.  By engaging in this virtual visit, you consent to the provision of healthcare and authorize for your insurance to be billed (if applicable) for the services provided during this visit. Depending on your insurance coverage, you may receive a charge related to this service.  I need to obtain your verbal consent now. Are you willing to proceed with your visit today? Yoshie Kosel has provided verbal consent on 06/22/2022 for a virtual visit (video or telephone). Piedad Climes, [place]  Date: 06/22/2022 1:27 PM  Virtual Visit via Video Note   I, Piedad Climes, connected with  Madysin Crisp  ([id number], July 08, 1986) on 06/22/22 at  1:15 PM EDT by a video-enabled telemedicine application and verified that I am speaking with the correct person using two identifiers.  Location: Patient: Virtual Visit Location Patient:  Home Provider: Virtual Visit Location Provider: Home Office   I discussed the limitations of evaluation and management by telemedicine and the availability of in person appointments. The patient expressed understanding and agreed to proceed.    History of Present Illness: Jessica Aguilar is a 36 y.o. who identifies as a female who was assigned female at birth, and is being seen today for 2 days of anorexia, abdominal cramping, bloating and frequent loose stools. Denies overt nausea or any vomiting. Denies fever, chills. Denies melena, hematochezia or tenesmus. Denies recent travel, sick contact or any new/abnormal food/water source.  HPI: HPI  Problems: There are no problems to display for this patient.   Allergies:  Allergies  Allergen Reactions   Amoxicillin Hives and Rash   Amoxicillin Hives   Medications:  Current Outpatient Medications:    famotidine (PEPCID) 40 MG tablet, Take 1 tablet (40 mg total) by mouth daily., Disp: 14 tablet, Rfl: 0   methylphenidate 18 MG PO CR tablet, Take 18 mg by mouth daily., Disp: , Rfl:    Syringe/Needle, Disp, (SYRINGE LUER SLIP) 25G X 5/8" 1 ML MISC, 1 mL by Does not apply route every 4 (four) months., Disp: 1 each, Rfl: 4  Observations/Objective: Patient is well-developed, well-nourished in no acute distress.  Resting comfortably at home.  Head is normocephalic, atraumatic.  No labored breathing. Speech is clear and coherent with logical content.  Patient is alert and oriented at baseline.   Assessment and Plan: 1. Viral gastroenteritis - famotidine (PEPCID) 40 MG  tablet; Take 1 tablet (40 mg total) by mouth daily.  Dispense: 14 tablet; Refill: 0  Afebrile. No alarm sign or symptoms present. Rest, hydrate. Start [employer]. Handout given. Begin daily probiotic. Famotidine per orders. Strict follow-up precautions and ER precautions reviewed.   Follow Up Instructions: I discussed the assessment and treatment plan with the patient. The patient was  provided an opportunity to ask questions and all were answered. The patient agreed with the plan and demonstrated an understanding of the instructions.  A copy of instructions were sent to the patient via MyChart unless otherwise noted below.   The patient was advised to call back or seek an in-person evaluation if the symptoms worsen or if the condition fails to improve as anticipated.  Time:  I spent 10 minutes with the patient via telehealth technology discussing the above problems/concerns.    Leeanne Rio, PA-C

## 2022-06-22 NOTE — Patient Instructions (Signed)
Sheryle Hail, thank you for joining Piedad Climes, PA-C for today's virtual visit.  While this provider is not your primary care provider (PCP), if your PCP is located in our provider database this encounter information will be shared with them immediately following your visit.   A [facility] MyChart account gives you access to today's visit and all your visits, tests, and labs performed at [facility] " click here if you don't have a [facility] MyChart account or go to [email or web address]  Consent: (Patient) Jessica Aguilar provided verbal consent for this virtual visit at the beginning of the encounter.  Current Medications:  Current Outpatient Medications:    phentermine (ADIPEX-P) 37.5 MG tablet, Take 1 tablet (37.5 mg total) by mouth daily before breakfast., Disp: 30 tablet, Rfl: 0   Syringe/Needle, Disp, (SYRINGE LUER SLIP) 25G X 5/8" 1 ML MISC, 1 mL by Does not apply route every 4 (four) months., Disp: 1 each, Rfl: 4   Medications ordered in this encounter:  No orders of the defined types were placed in this encounter.    *If you need refills on other medications prior to your next appointment, please contact your pharmacy*  Follow-Up: Call back or seek an in-person evaluation if the symptoms worsen or if the condition fails to improve as anticipated.  [facility] [phone]  Other Instructions Increase fluids. Rest. Start dietary recommendations below. You can use OTC Imodium if needed. Start a probiotic and Take the Famotidine as directed over the rest of the week If not resolving or any new/worsening symptoms despite treatment, please seek an in-person evaluation ASAP. DO NOT DELAY CARE.   Bland Diet A bland diet may consist of soft foods or foods that are not high in fat or are not greasy, acidic, or spicy. Avoiding certain foods may cause less irritation to your mouth, throat, stomach, or gastrointestinal tract. Avoiding certain  foods may make you feel better. Everyone's tolerances are different. A bland diet should be based on what you can tolerate and what may cause discomfort. What is my plan? Your health care provider or dietitian may recommend specific changes to your diet to treat your symptoms. These changes may include: Eating small meals frequently. Cooking food until it is soft enough to chew easily. Taking the time to chew your food thoroughly, so it is easy to swallow and digest. Avoiding foods that cause you discomfort. These may include spicy food, fried food, greasy foods, hard-to-chew foods, or citrus fruits and juices. Drinking slowly. What are tips for following this plan? Reading food labels To reduce fiber intake, look for food labels that say "whole," such as whole wheat or whole grain. Shopping Avoid food items that may have nuts or seeds. Avoid vegetables that may make you gassy or have a tough texture, such as broccoli, cauliflower, or corn. Cooking Cook foods thoroughly so they have a soft texture. Meal planning Make sure you include foods from all food groups to eat a balanced diet. Eat a variety of types of foods. Eat foods and drink beverages that do not cause you discomfort. These may include soups and broths with cooked meats, pasta, and vegetables. Lifestyle Sit up after meals, avoid tight clothing, and take time to eat and chew your food slowly. Ask your health care provider whether you should take dietary supplements. General information Mildly season your foods. Some seasonings, such as cayenne pepper, vinegar, or hot sauce, may cause irritation. The foods, beverages, or seasonings to avoid should  be based on individual tolerance. What foods should I eat? Fruits Canned or cooked fruit such as peaches, pears, or applesauce. Bananas. Vegetables Well-cooked vegetables. Canned or cooked vegetables such as carrots, green beans, beets, or spinach. Mashed or boiled  potatoes. Grains  Hot cereals, such as cream of wheat and processed oatmeal. Rice. Bread, crackers, pasta, or tortillas made from refined white flour. Meats and other proteins  Eggs. Creamy peanut butter or other nut butters. Lean, well-cooked tender meats, such as beef, pork, chicken, or fish. Dairy Low-fat dairy products such as milk, cottage cheese, or yogurt. Beverages  Water. Herbal tea. Apple juice. Fats and oils Mild salad dressings. Canola or olive oil. Sweets and desserts Low-fat pudding, custard, or ice cream. Fruit gelatin. The items listed above may not be a complete list of foods and beverages you can eat. Contact a dietitian for more information. What foods should I avoid? Fruits Citrus fruits, such as oranges and grapefruit. Fruits with a stringy texture. Fruits that have lots of seeds, such as kiwi or strawberries. Dried fruits. Vegetables Raw, uncooked vegetables. Salads. Grains Whole grain breads, muffins, and cereals. Meats and other proteins Tough, fibrous meats. Highly seasoned meat such as corned beef, smoked meats, or fish. Processed high-fat meats such as brats, hot dogs, or sausage. Dairy Full-fat dairy foods such as ice cream and cheese. Beverages Caffeinated drinks. Alcohol. Seasonings and condiments Strongly flavored seasonings or condiments. Hot sauce. Salsa. Other foods Spicy foods. Fried or greasy foods. Sour foods, such as pickled or fermented foods like sauerkraut. Foods high in fiber. The items listed above may not be a complete list of foods and beverages you should avoid. Contact a dietitian for more information. Summary A bland diet should be based on individual tolerance. It may consist of foods that are soft textured and do not have a lot of fat, fiber, acid, or seasonings. A bland diet may be recommended because avoiding certain foods, beverages, or spices may make you feel better. This information is not intended to replace advice given  to you by your health care provider. Make sure you discuss any questions you have with your health care provider. Document Revised: 06/27/2021 Document Reviewed: 06/27/2021 Elsevier Patient Education  [place].    If you have been instructed to have an in-person evaluation today at a local Urgent Care facility, please use the link below. It will take you to a list of all of our available [facility] [place] [facility], including address, phone number and hours of operation. Please do not delay care.  [facility]  If you or a family member do not have a primary care provider, use the link below to schedule a visit and establish care. When you choose a [facility] primary care physician or advanced practice provider, you gain a long-term partner in health. Find a Primary Care Provider  Learn more about [facility] in-office and virtual care options: [facility]

## 2022-07-15 DIAGNOSIS — R69 Illness, unspecified: Secondary | ICD-10-CM | POA: Diagnosis not present

## 2022-07-15 DIAGNOSIS — F411 Generalized anxiety disorder: Secondary | ICD-10-CM | POA: Diagnosis not present

## 2022-07-15 DIAGNOSIS — F901 Attention-deficit hyperactivity disorder, predominantly hyperactive type: Secondary | ICD-10-CM | POA: Diagnosis not present

## 2022-10-21 DIAGNOSIS — F411 Generalized anxiety disorder: Secondary | ICD-10-CM | POA: Diagnosis not present

## 2022-10-21 DIAGNOSIS — F901 Attention-deficit hyperactivity disorder, predominantly hyperactive type: Secondary | ICD-10-CM | POA: Diagnosis not present

## 2022-10-21 DIAGNOSIS — F39 Unspecified mood [affective] disorder: Secondary | ICD-10-CM | POA: Diagnosis not present

## 2022-11-08 ENCOUNTER — Ambulatory Visit: Payer: Self-pay

## 2022-12-09 ENCOUNTER — Telehealth: Payer: 59 | Admitting: Nurse Practitioner

## 2022-12-09 DIAGNOSIS — B3731 Acute candidiasis of vulva and vagina: Secondary | ICD-10-CM

## 2022-12-09 MED ORDER — FLUCONAZOLE 150 MG PO TABS
150.0000 mg | ORAL_TABLET | Freq: Once | ORAL | 0 refills | Status: AC
Start: 2022-12-09 — End: 2022-12-09

## 2022-12-09 NOTE — Patient Instructions (Signed)
  Sheryle Hail, thank you for joining Claiborne Rigg, NP for today's virtual visit.  While this provider is not your primary care provider (PCP), if your PCP is located in our provider database this encounter information will be shared with them immediately following your visit.   A [place] MyChart account gives you access to today's visit and all your visits, tests, and labs performed at [facility] " click here if you don't have a [facility] MyChart account or go to [email or web address]  Consent: (Patient) Jessica Aguilar provided verbal consent for this virtual visit at the beginning of the encounter.  Current Medications:  Current Outpatient Medications:    fluconazole (DIFLUCAN) 150 MG tablet, Take 1 tablet (150 mg total) by mouth once for 1 dose. May repeat x1 dose in 72 hours if not effective., Disp: 2 tablet, Rfl: 0   famotidine (PEPCID) 40 MG tablet, Take 1 tablet (40 mg total) by mouth daily., Disp: 14 tablet, Rfl: 0   methylphenidate 18 MG PO CR tablet, Take 18 mg by mouth daily., Disp: , Rfl:    Syringe/Needle, Disp, (SYRINGE LUER SLIP) 25G X 5/8" 1 ML MISC, 1 mL by Does not apply route every 4 (four) months., Disp: 1 each, Rfl: 4   Medications ordered in this encounter:  Meds ordered this encounter  Medications   fluconazole (DIFLUCAN) 150 MG tablet    Sig: Take 1 tablet (150 mg total) by mouth once for 1 dose. May repeat x1 dose in 72 hours if not effective.    Dispense:  2 tablet    Refill:  0    Order Specific Question:   Supervising Provider    Answer:   Merrilee Jansky [id number]     *If you need refills on other medications prior to your next appointment, please contact your pharmacy*  Follow-Up: Call back or seek an in-person evaluation if the symptoms worsen or if the condition fails to improve as anticipated.  [facility] [phone]    If you have been instructed to have an in-person evaluation today at a local Urgent Care  facility, please use the link below. It will take you to a list of all of our available [facility], including address, phone number and hours of operation. Please do not delay care.  [facility]  If you or a family member do not have a primary care provider, use the link below to schedule a visit and establish care. When you choose a [place] primary care physician or advanced practice provider, you gain a long-term partner in health. Find a Primary Care Provider  Learn more about [facility] in-office and virtual care options:  - Get Care Now

## 2022-12-09 NOTE — Progress Notes (Signed)
Virtual Visit Consent   [name], you are scheduled for a virtual visit with a [facility] provider today. Just as with appointments in the office, your consent must be obtained to participate. Your consent will be active for this visit and any virtual visit you may have with one of our providers in the next 365 days. If you have a MyChart account, a copy of this consent can be sent to you electronically.  As this is a virtual visit, video technology does not allow for your provider to perform a traditional examination. This may limit your provider's ability to fully assess your condition. If your provider identifies any concerns that need to be evaluated in person or the need to arrange testing (such as labs, EKG, etc.), we will make arrangements to do so. Although advances in technology are sophisticated, we cannot ensure that it will always work on either your end or our end. If the connection with a video visit is poor, the visit may have to be switched to a telephone visit. With either a video or telephone visit, we are not always able to ensure that we have a secure connection.  By engaging in this virtual visit, you consent to the provision of healthcare and authorize for your insurance to be billed (if applicable) for the services provided during this visit. Depending on your insurance coverage, you may receive a charge related to this service.  I need to obtain your verbal consent now. Are you willing to proceed with your visit today? Jessica Aguilar has provided verbal consent on 12/09/2022 for a virtual visit (video or telephone). Claiborne Rigg, NP  Date: 12/09/2022 10:13 AM  Virtual Visit via Video Note   I, Claiborne Rigg, connected with  Jessica Aguilar  ([id number], 03/13/1986) on 12/09/22 at 10:00 AM EDT by a video-enabled telemedicine application and verified that I am speaking with the correct person using two identifiers.  Location: Patient: Virtual Visit Location Patient:  Home Provider: Virtual Visit Location Provider: Home Office   I discussed the limitations of evaluation and management by telemedicine and the availability of in person appointments. The patient expressed understanding and agreed to proceed.    History of Present Illness: Jessica Aguilar is a 37 y.o. who identifies as a female who was assigned female at birth, and is being seen today for yeast vaginitis.   [name] is requesting medication for vaginal itching and irritation. She denies any malodorous discharge or any other GU or GI symptoms today.    Problems: There are no problems to display for this patient.   Allergies:  Allergies  Allergen Reactions   Amoxicillin Hives and Rash   Amoxicillin Hives   Medications:  Current Outpatient Medications:    fluconazole (DIFLUCAN) 150 MG tablet, Take 1 tablet (150 mg total) by mouth once for 1 dose. May repeat x1 dose in 72 hours if not effective., Disp: 2 tablet, Rfl: 0   famotidine (PEPCID) 40 MG tablet, Take 1 tablet (40 mg total) by mouth daily., Disp: 14 tablet, Rfl: 0   methylphenidate 18 MG PO CR tablet, Take 18 mg by mouth daily., Disp: , Rfl:    Syringe/Needle, Disp, (SYRINGE LUER SLIP) 25G X 5/8" 1 ML MISC, 1 mL by Does not apply route every 4 (four) months., Disp: 1 each, Rfl: 4  Observations/Objective: Patient is well-developed, well-nourished in no acute distress.  Resting comfortably at home.  Head is normocephalic, atraumatic.  No labored breathing.  Speech is clear and  coherent with logical content.  Patient is alert and oriented at baseline.    Assessment and Plan: 1. Yeast vaginitis - fluconazole (DIFLUCAN) 150 MG tablet; Take 1 tablet (150 mg total) by mouth once for 1 dose. May repeat x1 dose in 72 hours if not effective.  Dispense: 2 tablet; Refill: 0   Follow Up Instructions: I discussed the assessment and treatment plan with the patient. The patient was provided an opportunity to ask questions and all were  answered. The patient agreed with the plan and demonstrated an understanding of the instructions.  A copy of instructions were sent to the patient via MyChart unless otherwise noted below.    The patient was advised to call back or seek an in-person evaluation if the symptoms worsen or if the condition fails to improve as anticipated.  Time:  I spent 11 minutes with the patient via telehealth technology discussing the above problems/concerns.    Claiborne Rigg, NP

## 2023-01-21 ENCOUNTER — Ambulatory Visit
Admission: EM | Admit: 2023-01-21 | Discharge: 2023-01-21 | Disposition: A | Discharge status: Home / Self Care | Payer: 59 | Attending: Emergency Medicine | Admitting: Emergency Medicine

## 2023-01-21 DIAGNOSIS — J069 Acute upper respiratory infection, unspecified: Secondary | ICD-10-CM | POA: Diagnosis not present

## 2023-01-21 MED ORDER — PREDNISONE 10 MG (21) PO TBPK: ORAL_TABLET | Freq: Every day | ORAL | 0 refills | Status: DC

## 2023-01-21 MED ORDER — BENZONATATE 100 MG PO CAPS: 100.0000 mg | ORAL_CAPSULE | Freq: Three times a day (TID) | ORAL | 0 refills | Status: DC | PRN

## 2023-01-21 NOTE — ED Provider Notes (Signed)
[facility]    CSN: [id number] Arrival date & time: 01/21/23  0900      History   Chief Complaint Chief Complaint  Patient presents with   Nasal Congestion   Cough    HPI Jessica Aguilar is a 37 y.o. female.  Patient presents with 4-5 day history of congestion, postnasal drip, sore throat, cough, body aches.  No fever, rash, shortness of breath, chest pain, vomiting, diarrhea, or other symptoms.  No OTC medications taken today.  No pertinent medical history.  The history is provided by the patient and medical records.    Past Medical History:  Diagnosis Date   ADHD    Anxiety    Depression    Fracture of elbow with routine healing, right    Fracture of toe of left foot    Fracture of wrist    History of anemia    History of urinary tract infection    Ovarian cyst     There are no problems to display for this patient.   Past Surgical History:  Procedure Laterality Date   DENTAL SURGERY Bilateral    wisdom tooth removal x4      OB History   No obstetric history on file.      Home Medications    Prior to Admission medications   Medication Sig Start Date End Date Taking? Authorizing Provider  benzonatate (TESSALON) 100 MG capsule Take 1 capsule (100 mg total) by mouth 3 (three) times daily as needed for cough. 01/21/23  Yes Mickie Bail, NP  predniSONE (STERAPRED UNI-PAK 21 TAB) 10 MG (21) TBPK tablet Take by mouth daily. As directed 01/21/23  Yes Mickie Bail, NP  famotidine (PEPCID) 40 MG tablet Take 1 tablet (40 mg total) by mouth daily. Patient not taking: Reported on 01/21/2023 06/22/22   Waldon Merl, PA-C  methylphenidate 18 MG PO CR tablet Take 18 mg by mouth daily. Patient not taking: Reported on 01/21/2023 06/17/22   [provider]  Syringe/Needle, Disp, (SYRINGE LUER SLIP) 25G X 5/8" 1 ML MISC 1 mL by Does not apply route every 4 (four) months. 12/06/21   Doreene Burke, CNM    Family History Family History  Problem Relation Age of  Onset   Diabetes Father    Kidney disease Father    Hypertension Father    COPD Father    Heart failure Maternal Grandmother    Arthritis Maternal Grandmother    Depression Paternal Grandmother    Breast cancer Paternal Grandmother    Diabetes Paternal Grandfather    Lung disease Maternal Grandfather    Endocrine tumor Sister     Social History Social History   Tobacco Use   Smoking status: Never   Smokeless tobacco: Never  Vaping Use   Vaping Use: Never used  Substance Use Topics   Alcohol use: Yes    Comment: occasional   Drug use: Not Currently    Types: Marijuana    Comment: Last marijuana 6 years ago.     Allergies   Amoxicillin and Amoxicillin   Review of Systems Review of Systems  Constitutional:  Negative for chills and fever.  HENT:  Positive for congestion, postnasal drip, rhinorrhea and sore throat. Negative for ear pain.   Respiratory:  Positive for cough. Negative for shortness of breath.   Cardiovascular:  Negative for chest pain and palpitations.  Gastrointestinal:  Negative for diarrhea and vomiting.     Physical Exam Triage Vital Signs ED Triage  Vitals  Enc Vitals Group     BP      Pulse      Resp      Temp      Temp src      SpO2      Weight      Height      Head Circumference      Peak Flow      Pain Score      Pain Loc      Pain Edu?      Excl. in GC?    No data found.  Updated Vital Signs BP 121/79   Pulse 98   Temp 98.7 F (37.1 C)   Resp 18   LMP 12/26/2022   SpO2 97%   Visual Acuity Right Eye Distance:   Left Eye Distance:   Bilateral Distance:    Right Eye Near:   Left Eye Near:    Bilateral Near:     Physical Exam Vitals and nursing note reviewed.  Constitutional:      General: She is not in acute distress.    Appearance: Normal appearance. She is well-developed. She is not ill-appearing.  HENT:     Right Ear: Tympanic membrane normal.     Left Ear: Tympanic membrane normal.     Nose: Nose normal.      Mouth/Throat:     Mouth: Mucous membranes are moist.     Pharynx: Oropharynx is clear.     Comments: Clear PND. Cardiovascular:     Rate and Rhythm: Normal rate and regular rhythm.     Heart sounds: Normal heart sounds.  Pulmonary:     Effort: Pulmonary effort is normal. No respiratory distress.     Breath sounds: Normal breath sounds.  Musculoskeletal:     Cervical back: Neck supple.  Skin:    General: Skin is warm and dry.  Neurological:     Mental Status: She is alert.  Psychiatric:        Mood and Affect: Mood normal.        Behavior: Behavior normal.      UC Treatments / Results  Labs (all labs ordered are listed, but only abnormal results are displayed) Labs Reviewed - No data to display  EKG   Radiology No results found.  Procedures Procedures (including critical care time)  Medications Ordered in UC Medications - No data to display  Initial Impression / Assessment and Plan / UC Course  I have reviewed the triage vital signs and the nursing notes.  Pertinent labs & imaging results that were available during my care of the patient were reviewed by me and considered in my medical decision making (see chart for details).   Viral URI.  Lungs are clear and O2 sat is 97% on room air.  Treating with Tessalon Perles and prednisone taper.  Tylenol or ibuprofen as needed.  Education provided on viral URI.  Instructed patient to follow up with her PCP if her symptoms are not improving.  She agrees to plan of care.     Final Clinical Impressions(s) / UC Diagnoses   Final diagnoses:  Viral URI     Discharge Instructions      Take the prednisone and Tessalon as directed.    Follow up with your primary care provider if your symptoms are not improving.        ED Prescriptions     Medication Sig Dispense Auth. Provider   benzonatate (TESSALON) 100 MG capsule  Take 1 capsule (100 mg total) by mouth 3 (three) times daily as needed for cough. 21 capsule Mickie Bail, NP   predniSONE (STERAPRED UNI-PAK 21 TAB) 10 MG (21) TBPK tablet Take by mouth daily. As directed 21 tablet Mickie Bail, NP      PDMP not reviewed this encounter.   Mickie Bail, NP 01/21/23 [phone]

## 2023-01-21 NOTE — ED Triage Notes (Signed)
Patient to Urgent Care with complaints of  productive cough (discolored sputum)/ hoarseness/ chest congestion. Denies any known fevers- reports possibly on Tuesday with body aches.  Symptoms started one week ago. Family member also sick.

## 2023-01-21 NOTE — Discharge Instructions (Addendum)
Take the prednisone and Tessalon as directed.    Follow up with your primary care provider if your symptoms are not improving.     

## 2023-01-24 DIAGNOSIS — Z0189 Encounter for other specified special examinations: Secondary | ICD-10-CM | POA: Diagnosis not present

## 2023-01-24 DIAGNOSIS — R062 Wheezing: Secondary | ICD-10-CM | POA: Diagnosis not present

## 2023-01-24 DIAGNOSIS — J22 Unspecified acute lower respiratory infection: Secondary | ICD-10-CM | POA: Diagnosis not present

## 2023-01-24 DIAGNOSIS — R051 Acute cough: Secondary | ICD-10-CM | POA: Diagnosis not present

## 2023-02-20 ENCOUNTER — Telehealth: Payer: 59 | Admitting: Physician Assistant

## 2023-02-20 DIAGNOSIS — B3731 Acute candidiasis of vulva and vagina: Secondary | ICD-10-CM

## 2023-02-20 MED ORDER — FLUCONAZOLE 150 MG PO TABS
150.0000 mg | ORAL_TABLET | ORAL | 0 refills | Status: DC | PRN
Start: 2023-02-20 — End: 2023-02-23

## 2023-02-20 NOTE — Progress Notes (Signed)
Virtual Visit Consent   Jessica Aguilar, you are scheduled for a virtual visit with a [facility] provider today. Just as with appointments in the office, your consent must be obtained to participate. Your consent will be active for this visit and any virtual visit you may have with one of our providers in the next 365 days. If you have a MyChart account, a copy of this consent can be sent to you electronically.  As this is a virtual visit, video technology does not allow for your provider to perform a traditional examination. This may limit your provider's ability to fully assess your condition. If your provider identifies any concerns that need to be evaluated in person or the need to arrange testing (such as labs, EKG, etc.), we will make arrangements to do so. Although advances in technology are sophisticated, we cannot ensure that it will always work on either your end or our end. If the connection with a video visit is poor, the visit may have to be switched to a telephone visit. With either a video or telephone visit, we are not always able to ensure that we have a secure connection.  By engaging in this virtual visit, you consent to the provision of healthcare and authorize for your insurance to be billed (if applicable) for the services provided during this visit. Depending on your insurance coverage, you may receive a charge related to this service.  I need to obtain your verbal consent now. Are you willing to proceed with your visit today? Loxley Oherron has provided verbal consent on 02/20/2023 for a virtual visit (video or telephone). Margaretann Loveless, PA-C  Date: 02/20/2023 8:10 AM  Virtual Visit via Video Note   I, Margaretann Loveless, connected with  Jessica Aguilar  ([id number], 11-Aug-1986) on 02/20/23 at  8:00 AM EDT by a video-enabled telemedicine application and verified that I am speaking with the correct person using two identifiers.  Location: Patient: Virtual Visit Location Patient:  Home Provider: Virtual Visit Location Provider: Home Office   I discussed the limitations of evaluation and management by telemedicine and the availability of in person appointments. The patient expressed understanding and agreed to proceed.    History of Present Illness: Jessica Aguilar is a 37 y.o. who identifies as a female who was assigned female at birth, and is being seen today for vaginal discharge and itching.  HPI: Vaginal Discharge The patient's primary symptoms include genital itching and vaginal discharge. This is a new problem. The current episode started in the past 7 days. The problem occurs constantly. The problem has been gradually worsening. Pertinent negatives include no back pain, chills, constipation, diarrhea, dysuria, fever, flank pain, frequency, painful intercourse, sore throat or urgency. The vaginal discharge was white, thick and mucoid. There has been no bleeding. She has not been passing clots. She has not been passing tissue. The symptoms are aggravated by tactile pressure. Treatments tried: monistat 1. The treatment provided mild relief. She is sexually active. No, her partner does not have an STD.    Problems: There are no problems to display for this patient.   Allergies:  Allergies  Allergen Reactions   Amoxicillin Hives and Rash   Amoxicillin Hives   Medications:  Current Outpatient Medications:    fluconazole (DIFLUCAN) 150 MG tablet, Take 1 tablet (150 mg total) by mouth every 3 (three) days as needed., Disp: 2 tablet, Rfl: 0   benzonatate (TESSALON) 100 MG capsule, Take 1 capsule (100 mg total) by mouth 3 (  three) times daily as needed for cough., Disp: 21 capsule, Rfl: 0   famotidine (PEPCID) 40 MG tablet, Take 1 tablet (40 mg total) by mouth daily. (Patient not taking: Reported on 01/21/2023), Disp: 14 tablet, Rfl: 0   methylphenidate 18 MG PO CR tablet, Take 18 mg by mouth daily. (Patient not taking: Reported on 01/21/2023), Disp: , Rfl:    predniSONE  (STERAPRED UNI-PAK 21 TAB) 10 MG (21) TBPK tablet, Take by mouth daily. As directed, Disp: 21 tablet, Rfl: 0   Syringe/Needle, Disp, (SYRINGE LUER SLIP) 25G X 5/8" 1 ML MISC, 1 mL by Does not apply route every 4 (four) months., Disp: 1 each, Rfl: 4  Observations/Objective: Patient is well-developed, well-nourished in no acute distress.  Resting comfortably at home.  Head is normocephalic, atraumatic.  No labored breathing.  Speech is clear and coherent with logical content.  Patient is alert and oriented at baseline.    Assessment and Plan: 1. Yeast vaginitis - fluconazole (DIFLUCAN) 150 MG tablet; Take 1 tablet (150 mg total) by mouth every 3 (three) days as needed.  Dispense: 2 tablet; Refill: 0  - Symptoms consistent with Yeast vaginitis - Fluconazole prescribed - Limit bubble baths, scented lotions/soaps/detergents - Limit tight fitting clothing - Seek on person evaluation if not improving or if symptoms worsen   Follow Up Instructions: I discussed the assessment and treatment plan with the patient. The patient was provided an opportunity to ask questions and all were answered. The patient agreed with the plan and demonstrated an understanding of the instructions.  A copy of instructions were sent to the patient via MyChart unless otherwise noted below.    The patient was advised to call back or seek an in-person evaluation if the symptoms worsen or if the condition fails to improve as anticipated.  Time:  I spent 10 minutes with the patient via telehealth technology discussing the above problems/concerns.    Margaretann Loveless, PA-C

## 2023-02-20 NOTE — Patient Instructions (Signed)
Jessica Aguilar, thank you for joining Jessica Loveless, PA-C for today's virtual visit.  While this provider is not your primary care provider (PCP), if your PCP is located in our provider database this encounter information will be shared with them immediately following your visit.   A [facility] MyChart account gives you access to today's visit and all your visits, tests, and labs performed at [facility] " click here if you don't have a [facility] MyChart account or go to [email or web address]  Consent: (Patient) Jessica Aguilar provided verbal consent for this virtual visit at the beginning of the encounter.  Current Medications:  Current Outpatient Medications:    fluconazole (DIFLUCAN) 150 MG tablet, Take 1 tablet (150 mg total) by mouth every 3 (three) days as needed., Disp: 2 tablet, Rfl: 0   benzonatate (TESSALON) 100 MG capsule, Take 1 capsule (100 mg total) by mouth 3 (three) times daily as needed for cough., Disp: 21 capsule, Rfl: 0   famotidine (PEPCID) 40 MG tablet, Take 1 tablet (40 mg total) by mouth daily. (Patient not taking: Reported on 01/21/2023), Disp: 14 tablet, Rfl: 0   methylphenidate 18 MG PO CR tablet, Take 18 mg by mouth daily. (Patient not taking: Reported on 01/21/2023), Disp: , Rfl:    predniSONE (STERAPRED UNI-PAK 21 TAB) 10 MG (21) TBPK tablet, Take by mouth daily. As directed, Disp: 21 tablet, Rfl: 0   Syringe/Needle, Disp, (SYRINGE LUER SLIP) 25G X 5/8" 1 ML MISC, 1 mL by Does not apply route every 4 (four) months., Disp: 1 each, Rfl: 4   Medications ordered in this encounter:  Meds ordered this encounter  Medications   fluconazole (DIFLUCAN) 150 MG tablet    Sig: Take 1 tablet (150 mg total) by mouth every 3 (three) days as needed.    Dispense:  2 tablet    Refill:  0    Order Specific Question:   Supervising Provider    Answer:   Merrilee Jansky [id number]     *If you need refills on other medications prior to your next appointment, please  contact your pharmacy*  Follow-Up: Call back or seek an in-person evaluation if the symptoms worsen or if the condition fails to improve as anticipated.  [facility] [phone]  Other Instructions Vaginal Yeast Infection, Adult  Vaginal yeast infection is a condition that causes vaginal discharge as well as soreness, swelling, and redness (inflammation) of the vagina. This is a common condition. Some women get this infection frequently. What are the causes? This condition is caused by a change in the normal balance of the yeast (Candida) and normal bacteria that live in the vagina. This change causes an overgrowth of yeast, which causes the inflammation. What increases the risk? The condition is more likely to develop in women who: Take antibiotic medicines. Have diabetes. Take birth control pills. Are pregnant. Douche often. Have a weak body defense system (immune system). Have been taking steroid medicines for a long time. Frequently wear tight clothing. What are the signs or symptoms? Symptoms of this condition include: White, thick, creamy vaginal discharge. Swelling, itching, redness, and irritation of the vagina. The lips of the vagina (labia) may be affected as well. Pain or a burning feeling while urinating. Pain during sex. How is this diagnosed? This condition is diagnosed based on: Your medical history. A physical exam. A pelvic exam. Your health care provider will examine a sample of your vaginal discharge under a microscope. Your health care provider  may send this sample for testing to confirm the diagnosis. How is this treated? This condition is treated with medicine. Medicines may be over-the-counter or prescription. You may be told to use one or more of the following: Medicine that is taken by mouth (orally). Medicine that is applied as a cream (topically). Medicine that is inserted directly into the vagina (suppository). Follow these  instructions at home: Take or apply over-the-counter and prescription medicines only as told by your health care provider. Do not use tampons until your health care provider approves. Do not have sex until your infection has cleared. Sex can prolong or worsen your symptoms of infection. Ask your health care provider when it is safe to resume sexual activity. Keep all follow-up visits. This is important. How is this prevented?  Do not wear tight clothes, such as pantyhose or tight pants. Wear breathable cotton underwear. Do not use douches, perfumed soap, creams, or powders. Wipe from front to back after using the toilet. If you have diabetes, keep your blood sugar levels under control. Ask your health care provider for other ways to prevent yeast infections. Contact a health care provider if: You have a fever. Your symptoms go away and then return. Your symptoms do not get better with treatment. Your symptoms get worse. You have new symptoms. You develop blisters in or around your vagina. You have blood coming from your vagina and it is not your menstrual period. You develop pain in your abdomen. Summary Vaginal yeast infection is a condition that causes discharge as well as soreness, swelling, and redness (inflammation) of the vagina. This condition is treated with medicine. Medicines may be over-the-counter or prescription. Take or apply over-the-counter and prescription medicines only as told by your health care provider. Do not douche. Resume sexual activity or use of tampons as instructed by your health care provider. Contact a health care provider if your symptoms do not get better with treatment or your symptoms go away and then return. This information is not intended to replace advice given to you by your health care provider. Make sure you discuss any questions you have with your health care provider. Document Revised: 10/25/2020 Document Reviewed: 10/25/2020 Elsevier Patient  Education  2024 [employer]    If you have been instructed to have an in-person evaluation today at a local Urgent Care facility, please use the link below. It will take you to a list of all of our available [facility], including address, phone number and hours of operation. Please do not delay care.  [facility]  If you or a family member do not have a primary care provider, use the link below to schedule a visit and establish care. When you choose a [facility] primary care physician or advanced practice provider, you gain a long-term partner in health. Find a Primary Care Provider  Learn more about [facility]'s in-office and virtual care options: [facility] - Get Care Now

## 2023-02-21 ENCOUNTER — Ambulatory Visit
Admission: EM | Admit: 2023-02-21 | Discharge: 2023-02-21 | Disposition: A | Discharge status: Home / Self Care | Payer: 59 | Attending: Emergency Medicine | Admitting: Emergency Medicine

## 2023-02-21 ENCOUNTER — Telehealth: Payer: Self-pay

## 2023-02-21 DIAGNOSIS — N898 Other specified noninflammatory disorders of vagina: Secondary | ICD-10-CM | POA: Diagnosis not present

## 2023-02-21 DIAGNOSIS — Z3202 Encounter for pregnancy test, result negative: Secondary | ICD-10-CM | POA: Diagnosis not present

## 2023-02-21 LAB — POCT URINE PREGNANCY: Preg Test, Ur: NEGATIVE

## 2023-02-21 LAB — POCT URINALYSIS DIP (MANUAL ENTRY)
Bilirubin, UA: NEGATIVE
Blood, UA: NEGATIVE
Glucose, UA: NEGATIVE mg/dL
Ketones, POC UA: NEGATIVE mg/dL
Leukocytes, UA: NEGATIVE
Nitrite, UA: NEGATIVE
Protein Ur, POC: NEGATIVE mg/dL
Spec Grav, UA: >=1.03 — AB (ref 1.010–1.025)
Urobilinogen, UA: 0.2 E.U./dL
pH, UA: 5 (ref 5.0–8.0)

## 2023-02-21 MED ORDER — METRONIDAZOLE 0.75 % VA GEL: 1.0000 | Freq: Every day | VAGINAL | 0 refills | Status: AC

## 2023-02-21 NOTE — ED Triage Notes (Signed)
Patient to Urgent Care with complaints of vaginal itching/ vaginal discharge. Reports symptoms started three days ago. Recent beach trip.  Denies any urinary symptoms.

## 2023-02-21 NOTE — Telephone Encounter (Signed)
Pt called scheduling line but couldn't get through; thinks she has BV; also has a cyst she would like looked at and she needs her annual.  Adv she may need to have a problem visit first and then have the annual. Pt aware to watch for a call from our schedulers. [phone]

## 2023-02-21 NOTE — ED Provider Notes (Signed)
Renaldo Fiddler    CSN: [id number] Arrival date & time: 02/21/23  1844      History   Chief Complaint Chief Complaint  Patient presents with   Vaginal Discharge    HPI Jessica Aguilar is a 37 y.o. female.  Patient presents with 3-day history of vaginal discharge and itching.  She is concerned for bacterial vaginitis.  She had an e-visit yesterday and was prescribed fluconazole.  She took the first dose yesterday and her symptoms have not improved.  She denies fever, rash, pelvic pain, abdominal pain, dysuria, hematuria, or other symptoms.  The history is provided by the patient and medical records.    Past Medical History:  Diagnosis Date   ADHD    Anxiety    Depression    Fracture of elbow with routine healing, right    Fracture of toe of left foot    Fracture of wrist    History of anemia    History of urinary tract infection    Ovarian cyst     There are no problems to display for this patient.   Past Surgical History:  Procedure Laterality Date   DENTAL SURGERY Bilateral    wisdom tooth removal x4      OB History   No obstetric history on file.      Home Medications    Prior to Admission medications   Medication Sig Start Date End Date Taking? Authorizing Provider  metroNIDAZOLE (METROGEL) 0.75 % vaginal gel Place 1 Applicatorful vaginally at bedtime for 5 days. 02/21/23 02/26/23 Yes Mickie Bail, NP  benzonatate (TESSALON) 100 MG capsule Take 1 capsule (100 mg total) by mouth 3 (three) times daily as needed for cough. 01/21/23   Mickie Bail, NP  famotidine (PEPCID) 40 MG tablet Take 1 tablet (40 mg total) by mouth daily. Patient not taking: Reported on 01/21/2023 06/22/22   Waldon Merl, PA-C  fluconazole (DIFLUCAN) 150 MG tablet Take 1 tablet (150 mg total) by mouth every 3 (three) days as needed. 02/20/23   Margaretann Loveless, PA-C  methylphenidate 18 MG PO CR tablet Take 18 mg by mouth daily. Patient not taking: Reported on 01/21/2023 06/17/22    [provider]  predniSONE (STERAPRED UNI-PAK 21 TAB) 10 MG (21) TBPK tablet Take by mouth daily. As directed 01/21/23   Mickie Bail, NP  Syringe/Needle, Disp, (SYRINGE LUER SLIP) 25G X 5/8" 1 ML MISC 1 mL by Does not apply route every 4 (four) months. 12/06/21   Doreene Burke, CNM    Family History Family History  Problem Relation Age of Onset   Diabetes Father    Kidney disease Father    Hypertension Father    COPD Father    Heart failure Maternal Grandmother    Arthritis Maternal Grandmother    Depression Paternal Grandmother    Breast cancer Paternal Grandmother    Diabetes Paternal Grandfather    Lung disease Maternal Grandfather    Endocrine tumor Sister     Social History Social History   Tobacco Use   Smoking status: Never   Smokeless tobacco: Never  Vaping Use   Vaping Use: Never used  Substance Use Topics   Alcohol use: Yes    Comment: occasional   Drug use: Not Currently    Types: Marijuana    Comment: Last marijuana 6 years ago.     Allergies   Amoxicillin and Amoxicillin   Review of Systems Review of Systems  Constitutional:  Negative  for chills and fever.  Gastrointestinal:  Negative for abdominal pain, nausea and vomiting.  Genitourinary:  Positive for vaginal discharge. Negative for dysuria, flank pain, frequency, hematuria and pelvic pain.  Skin:  Negative for color change and rash.  All other systems reviewed and are negative.    Physical Exam Triage Vital Signs ED Triage Vitals  Enc Vitals Group     BP      Pulse      Resp      Temp      Temp src      SpO2      Weight      Height      Head Circumference      Peak Flow      Pain Score      Pain Loc      Pain Edu?      Excl. in GC?    No data found.  Updated Vital Signs BP 115/81   Pulse 100   Temp 97.7 F (36.5 C)   Resp 18   LMP 01/28/2023   SpO2 97%   Visual Acuity Right Eye Distance:   Left Eye Distance:   Bilateral Distance:    Right Eye Near:    Left Eye Near:    Bilateral Near:     Physical Exam Vitals and nursing note reviewed.  Constitutional:      General: She is not in acute distress.    Appearance: She is well-developed.  HENT:     Mouth/Throat:     Mouth: Mucous membranes are moist.  Cardiovascular:     Rate and Rhythm: Normal rate and regular rhythm.     Heart sounds: Normal heart sounds.  Pulmonary:     Effort: Pulmonary effort is normal. No respiratory distress.     Breath sounds: Normal breath sounds.  Abdominal:     General: Bowel sounds are normal.     Palpations: Abdomen is soft.     Tenderness: There is no abdominal tenderness. There is no right CVA tenderness, left CVA tenderness, guarding or rebound.  Musculoskeletal:     Cervical back: Neck supple.  Skin:    General: Skin is warm and dry.  Neurological:     Mental Status: She is alert.  Psychiatric:        Mood and Affect: Mood normal.        Behavior: Behavior normal.      UC Treatments / Results  Labs (all labs ordered are listed, but only abnormal results are displayed) Labs Reviewed  POCT URINALYSIS DIP (MANUAL ENTRY) - Abnormal; Notable for the following components:      Result Value   Clarity, UA cloudy (*)    Spec Grav, UA >=1.030 (*)    All other components within normal limits  POCT URINE PREGNANCY  CERVICOVAGINAL ANCILLARY ONLY    EKG   Radiology No results found.  Procedures Procedures (including critical care time)  Medications Ordered in UC Medications - No data to display  Initial Impression / Assessment and Plan / UC Course  I have reviewed the triage vital signs and the nursing notes.  Pertinent labs & imaging results that were available during my care of the patient were reviewed by me and considered in my medical decision making (see chart for details).    Vaginal itching, vaginal discharge, negative pregnancy test.  Patient had an e-visit yesterday and was prescribed Diflucan.  She has taken 1 dose and is  not improving.  She is concerned for bacterial vaginitis.  Discussed that she should take the second dose as prescribed.  Treating today with MetroGel (patient prefers gel rather than oral).  Instructed patient to abstain from sexual activity for at least 7 days.  Instructed her to follow-up with her PCP or gynecologist if her symptoms are not improving.  Patient agrees to plan of care.   Final Clinical Impressions(s) / UC Diagnoses   Final diagnoses:  Vaginal itching  Vaginal discharge  Negative pregnancy test     Discharge Instructions      Use the MetroGel as directed.    Your tests are pending.  If your test results are positive, we will call you.  Do not have sexual activity for at least 7 days.    Follow up with your primary care provider if your symptoms are not improving.        ED Prescriptions     Medication Sig Dispense Auth. Provider   metroNIDAZOLE (METROGEL) 0.75 % vaginal gel Place 1 Applicatorful vaginally at bedtime for 5 days. 50 g Mickie Bail, NP      PDMP not reviewed this encounter.   Mickie Bail, NP 02/21/23 [phone]

## 2023-02-21 NOTE — Telephone Encounter (Signed)
I contacted the patent via phone to offer an same day nurse visit/ Self swab appointment to check for possible BV at 4:15 pm. The patient reports she works in [place] and will back after speaking with Engineer, site. I sent secure message to [place] [place] about the patient's concern with poss cyst. Per Melissa "if pelvic pain is severe to go to Emergency room". The patient states "she don't have severe pain but she would rather be seen in clinic". I advise the patient we could call her on Friday with any cancellations. The patient said "that would be great".

## 2023-02-21 NOTE — Discharge Instructions (Addendum)
Use the MetroGel as directed.    Your tests are pending.  If your test results are positive, we will call you.  Do not have sexual activity for at least 7 days.    Follow up with your primary care provider if your symptoms are not improving.

## 2023-02-23 ENCOUNTER — Ambulatory Visit (INDEPENDENT_AMBULATORY_CARE_PROVIDER_SITE_OTHER): Payer: 59 | Admitting: Obstetrics and Gynecology

## 2023-02-23 ENCOUNTER — Encounter: Payer: Self-pay | Admitting: Obstetrics and Gynecology

## 2023-02-23 VITALS — BP 115/78 | HR 86 | Ht 67.0 in | Wt 198.0 lb

## 2023-02-23 DIAGNOSIS — N898 Other specified noninflammatory disorders of vagina: Secondary | ICD-10-CM

## 2023-02-23 DIAGNOSIS — Z30017 Encounter for initial prescription of implantable subdermal contraceptive: Secondary | ICD-10-CM

## 2023-02-23 LAB — POCT WET PREP WITH KOH
Clue Cells Wet Prep HPF POC: NEGATIVE
KOH Prep POC: NEGATIVE
Trichomonas, UA: NEGATIVE
Yeast Wet Prep HPF POC: NEGATIVE

## 2023-02-23 LAB — CERVICOVAGINAL ANCILLARY ONLY
Bacterial Vaginitis (gardnerella): NEGATIVE
Candida Glabrata: NEGATIVE
Candida Vaginitis: POSITIVE — AB
Chlamydia: NEGATIVE
Comment: NEGATIVE
Comment: NEGATIVE
Comment: NEGATIVE
Comment: NEGATIVE
Comment: NEGATIVE
Comment: NORMAL
Neisseria Gonorrhea: NEGATIVE
Trichomonas: NEGATIVE

## 2023-02-23 NOTE — Progress Notes (Signed)
Pcp, No   Chief Complaint  Patient presents with   Vaginal Discharge    Itching and irritation, no vag odor x 6 days   Contraception    Not sure what BC method    HPI:      [name] is a 37 y.o. G1P0010 whose LMP was Patient's last menstrual period was 01/28/2023 (exact date)., presents today for increased vag d/c and irritation for 6 days. Took diflucan with sx start and when didn't have sx improvement in 24 hrs, went to urgent care. Treated with metrogel for BV given hx of BV in past; no fishy odor at time. Culture done, still waiting on results. Pt took 2nd diflucan today. Itching is a little improved but the d/c is chunky and worrisome to pt. Was on abx prior to sx. No urin sx.  She is sexually active with husband, no new partners. Would like BC.  Menses are monthly, lasting 4 days, mod flow, no BTB, mild dysmen, no meds needed. Did OCPs, depo with wt gain, nexplanon in past. Would like nexplanon again.   Neg pap/neg HPV DNA 2/23  Past Medical History:  Diagnosis Date   ADHD    Anxiety    Depression    Fracture of elbow with routine healing, right    Fracture of toe of left foot    Fracture of wrist    History of anemia    History of urinary tract infection    Ovarian cyst      Past Surgical History:  Procedure Laterality Date   DENTAL SURGERY Bilateral    wisdom tooth removal x4      Family History  Problem Relation Age of Onset   Diabetes Father    Kidney disease Father    Hypertension Father    COPD Father    Heart failure Maternal Grandmother    Arthritis Maternal Grandmother    Depression Paternal Grandmother    Breast cancer Paternal Grandmother    Diabetes Paternal Grandfather    Lung disease Maternal Grandfather    Endocrine tumor Sister     Social History   Socioeconomic History   Marital status: Married    Spouse name: Not on file   Number of children: 0   Years of education: 14   Highest education level: High school graduate   Occupational History   Not on file  Tobacco Use   Smoking status: Never   Smokeless tobacco: Never  Vaping Use   Vaping Use: Former   Substances: Nicotine  Substance and Sexual Activity   Alcohol use: Yes    Comment: occasional   Drug use: Not Currently    Types: Marijuana    Comment: Last marijuana 6 years ago.   Sexual activity: Yes    Partners: Male    Birth control/protection: None  Other Topics Concern   Not on file  Social History Narrative   ** Merged History Encounter **       Social Determinants of Health   Financial Resource Strain: Not on file  Food Insecurity: Not on file  Transportation Needs: Not on file  Physical Activity: Not on file  Stress: Not on file  Social Connections: Not on file  Intimate Partner Violence: Not At Risk (12/31/2020)   Humiliation, Afraid, Rape, and Kick questionnaire    Fear of Current or Ex-Partner: No    Emotionally Abused: No    Physically Abused: No    Sexually Abused: No  Outpatient Medications Prior to Visit  Medication Sig Dispense Refill   albuterol (VENTOLIN HFA) 108 (90 Base) MCG/ACT inhaler Inhale into the lungs.     chlorpheniramine-HYDROcodone (TUSSIONEX) 10-8 MG/5ML Take by mouth.     methylphenidate 18 MG PO CR tablet Take 18 mg by mouth daily.     metroNIDAZOLE (METROGEL) 0.75 % vaginal gel Place 1 Applicatorful vaginally at bedtime for 5 days. 50 g 0   benzonatate (TESSALON) 100 MG capsule Take 1 capsule (100 mg total) by mouth 3 (three) times daily as needed for cough. 21 capsule 0   famotidine (PEPCID) 40 MG tablet Take 1 tablet (40 mg total) by mouth daily. (Patient not taking: Reported on 01/21/2023) 14 tablet 0   fluconazole (DIFLUCAN) 150 MG tablet Take 1 tablet (150 mg total) by mouth every 3 (three) days as needed. 2 tablet 0   predniSONE (STERAPRED UNI-PAK 21 TAB) 10 MG (21) TBPK tablet Take by mouth daily. As directed 21 tablet 0   Syringe/Needle, Disp, (SYRINGE LUER SLIP) 25G X 5/8" 1 ML MISC 1 mL by  Does not apply route every 4 (four) months. 1 each 4   No facility-administered medications prior to visit.      ROS:  Review of Systems  Constitutional:  Negative for fever.  Gastrointestinal:  Negative for blood in stool, constipation, diarrhea, nausea and vomiting.  Genitourinary:  Positive for vaginal discharge. Negative for dyspareunia, dysuria, flank pain, frequency, hematuria, urgency, vaginal bleeding and vaginal pain.  Musculoskeletal:  Negative for back pain.  Skin:  Negative for rash.   BREAST: No symptoms   OBJECTIVE:   Vitals:  BP 115/78   Pulse 86   Ht 5\' 7"  (1.702 m)   Wt 198 lb (89.8 kg)   LMP 01/28/2023 (Exact Date)   BMI 31.01 kg/m   Physical Exam Vitals reviewed.  Constitutional:      Appearance: She is well-developed.  Pulmonary:     Effort: Pulmonary effort is normal.  Genitourinary:    General: Normal vulva.     Pubic Area: No rash.      Labia:        Right: No rash, tenderness or lesion.        Left: No rash, tenderness or lesion.      Vagina: Vaginal discharge present. No erythema or tenderness.     Cervix: Normal.     Uterus: Normal. Not enlarged and not tender.      Adnexa: Right adnexa normal and left adnexa normal.       Right: No mass or tenderness.         Left: No mass or tenderness.       Comments: THICK, CLUMPY WHITE D/C, C/W METROGEL USE Musculoskeletal:        General: Normal range of motion.     Cervical back: Normal range of motion.  Skin:    General: Skin is warm and dry.  Neurological:     General: No focal deficit present.     Mental Status: She is alert and oriented to person, place, and time.  Psychiatric:        Mood and Affect: Mood normal.        Behavior: Behavior normal.        Thought Content: Thought content normal.        Judgment: Judgment normal.     Results: Results for orders placed or performed in visit on 02/23/23 (from the past 24 hour(s))  POCT Wet Prep with  KOH     Status: Normal    Collection Time: 02/23/23  5:22 PM  Result Value Ref Range   Trichomonas, UA Negative    Clue Cells Wet Prep HPF POC neg    Epithelial Wet Prep HPF POC     Yeast Wet Prep HPF POC neg    Bacteria Wet Prep HPF POC     RBC Wet Prep HPF POC     WBC Wet Prep HPF POC     KOH Prep POC Negative Negative     Assessment/Plan: Vaginal itching - Plan: POCT Wet Prep with KOH; neg wet prep, neg exam. Has treated with 2 diflucan. Pt to wait for culture results. OTC hydrocortisone crm/cool compresses in meantime. Reassurance re: vag d/c c/w metrogel. F/u prn.   Encounter for initial prescription of implantable subdermal contraceptive--procedure discussed. Pt will RTO with menses for insertion.     Return if symptoms worsen or fail to improve.  Kortne All B. Jalasia Eskridge, PA-C 02/23/2023 5:22 PM

## 2023-02-23 NOTE — Telephone Encounter (Signed)
The patient has been contacted and scheduled for 4:15 pm with ABC due to opening on 7/5.

## 2023-02-26 ENCOUNTER — Ambulatory Visit: Payer: 59 | Admitting: Obstetrics and Gynecology

## 2023-02-26 NOTE — Progress Notes (Unsigned)
   No chief complaint on file.    HPI:  Jessica Aguilar is a 37 y.o. G1P0010 here for Nexplanon insertion. No GYN concerns.  Last pap smear was normal.  LMP 01/28/2023 (Exact Date)    Nexplanon Insertion  Patient given informed consent, signed copy in the chart, time out was performed. Pregnancy test was neg. *** Appropriate time out taken.  Patient's LEFT/RIGHT *** arm was prepped and draped in the usual sterile fashion. The ruler used to measure and mark insertion area.  Pt was prepped with betadine swab and then injected with 1.0 cc of 2% lidocaine with epinephrine. Nexplanon removed form packaging,  Device confirmed in needle, then inserted full length of needle and withdrawn per handbook instructions.  Pt insertion site covered with steri-strip and a bandage.   Minimal blood loss.  Pt tolerated the procedure welL.  Assessment: No diagnosis found.   No orders of the defined types were placed in this encounter.    Plan:   She was told to remove the dressing in 12-24 hours, to keep the incision area dry for 24 hours and to remove the Steristrip in 2-3  days.  Notify us if any signs of tenderness, redness, pain, or fevers develop.   Aaryanna Hyden B. Annely Sliva, PA-C 02/26/2023 4:50 PM

## 2023-02-26 NOTE — Telephone Encounter (Signed)
I contacted the patient via phone. She is scheduled for tomorrow 7/9 at 8:55 am with ABC.

## 2023-02-27 ENCOUNTER — Ambulatory Visit (INDEPENDENT_AMBULATORY_CARE_PROVIDER_SITE_OTHER): Payer: 59 | Admitting: Obstetrics and Gynecology

## 2023-02-27 ENCOUNTER — Encounter: Payer: Self-pay | Admitting: Obstetrics and Gynecology

## 2023-02-27 VITALS — BP 104/70 | Ht 67.0 in | Wt 199.0 lb

## 2023-02-27 DIAGNOSIS — Z30017 Encounter for initial prescription of implantable subdermal contraceptive: Secondary | ICD-10-CM | POA: Diagnosis not present

## 2023-02-27 MED ORDER — ETONOGESTREL 68 MG ~~LOC~~ IMPL
68.0000 mg | DRUG_IMPLANT | Freq: Once | SUBCUTANEOUS | Status: AC
Start: 2023-02-27 — End: 2023-02-27
  Administered 2023-02-27: 68 mg via SUBCUTANEOUS

## 2023-02-27 NOTE — Patient Instructions (Signed)
I value your feedback and you entrusting us with your care. If you get a [facility] patient survey, I would appreciate you taking the time to let us know about your experience today. Thank you!  Remove the dressing in 24 hours,  keep the incision area dry for 24 hours and remove the Steristrip in 2-3  days.  Notify us if any signs of tenderness, redness, pain, or fevers develop.   

## 2023-03-13 NOTE — Telephone Encounter (Signed)
Pt saw ABC on 02/27/2023 at 8:55.

## 2023-03-24 DIAGNOSIS — F901 Attention-deficit hyperactivity disorder, predominantly hyperactive type: Secondary | ICD-10-CM | POA: Diagnosis not present

## 2023-03-24 DIAGNOSIS — F39 Unspecified mood [affective] disorder: Secondary | ICD-10-CM | POA: Diagnosis not present

## 2023-03-24 DIAGNOSIS — F411 Generalized anxiety disorder: Secondary | ICD-10-CM | POA: Diagnosis not present

## 2023-04-12 ENCOUNTER — Telehealth: Payer: Self-pay

## 2023-04-12 NOTE — Telephone Encounter (Signed)
Pt calling; had nexplanon inserted a month ago; having side effects; can it be taken out?  [phone]  Pt states she just finished getting a covid test; has extreme fatigue all of a sudden.  Adv it takes the body a good three months to adjust to bc; adv she is okay unless saturating a pad every 30 minutes to one hour; pt states she isn't doing that; it's just old blood coming out.  Pt has appt on the 27th; adv I will add to the note "possible removal".  Her bleeding is affecting sex life.

## 2023-04-17 ENCOUNTER — Ambulatory Visit: Payer: 59 | Admitting: Obstetrics & Gynecology

## 2023-04-20 ENCOUNTER — Telehealth: Payer: Self-pay

## 2023-04-20 MED ORDER — FLUCONAZOLE 150 MG PO TABS: 150.0000 mg | ORAL_TABLET | Freq: Once | ORAL | 0 refills | Status: AC

## 2023-04-20 NOTE — Telephone Encounter (Signed)
Patient contacted office today with concern of possible yeast infection. Patient reports concerns of internal and external vaginal itching 24hrs after intercourse with her spouse. Patient denied vaginal odor or discharge and stated that she has a recurrent history of yeast infection. Patient stated that she would not be able to schedule an appointment for evaluation in office until 5pm because of her work schedule. Given patients history of recurrent yeast I advised patient that I will fill a one time prescription for Diflucan to take. Patient advised to abstain from intercourse for at least 5-7 days after treatment to make sure infection has cleared, patient instructed to come to office for visit if symptoms do not clear. Patient verbalized understanding. KW

## 2023-04-21 DIAGNOSIS — F411 Generalized anxiety disorder: Secondary | ICD-10-CM | POA: Diagnosis not present

## 2023-04-21 DIAGNOSIS — F39 Unspecified mood [affective] disorder: Secondary | ICD-10-CM | POA: Diagnosis not present

## 2023-04-21 DIAGNOSIS — F901 Attention-deficit hyperactivity disorder, predominantly hyperactive type: Secondary | ICD-10-CM | POA: Diagnosis not present

## 2023-05-01 DIAGNOSIS — R42 Dizziness and giddiness: Secondary | ICD-10-CM | POA: Diagnosis not present

## 2023-05-01 DIAGNOSIS — R070 Pain in throat: Secondary | ICD-10-CM | POA: Diagnosis not present

## 2023-05-05 DIAGNOSIS — F901 Attention-deficit hyperactivity disorder, predominantly hyperactive type: Secondary | ICD-10-CM | POA: Diagnosis not present

## 2023-05-05 DIAGNOSIS — F39 Unspecified mood [affective] disorder: Secondary | ICD-10-CM | POA: Diagnosis not present

## 2023-05-05 DIAGNOSIS — F411 Generalized anxiety disorder: Secondary | ICD-10-CM | POA: Diagnosis not present

## 2023-05-10 IMAGING — US US PELVIS COMPLETE TRANSABD/TRANSVAG W DUPLEX AND/OR DOPPLER
1 series · 15 of 25 positions shown · non-contrast
Comparison: None.

CLINICAL DATA: Pelvic pain

EXAM:
TRANSABDOMINAL AND TRANSVAGINAL ULTRASOUND OF PELVIS
DOPPLER ULTRASOUND OF OVARIES
TECHNIQUE: Both transabdominal and transvaginal ultrasound examinations of the
pelvis were performed. Transabdominal technique was performed for
global imaging of the pelvis including uterus, ovaries, adnexal
regions, and pelvic cul-de-sac.
It was necessary to proceed with endovaginal exam following the
transabdominal exam to visualize the endometrium and ovaries.
Transvaginal portion of the exam is limited due to patient pain.
Color and duplex Doppler ultrasound was utilized to evaluate blood
flow to the ovaries.

[Series 1: us transvaginal non-ob · 15 of 103 slices shown]
[im 1/103]
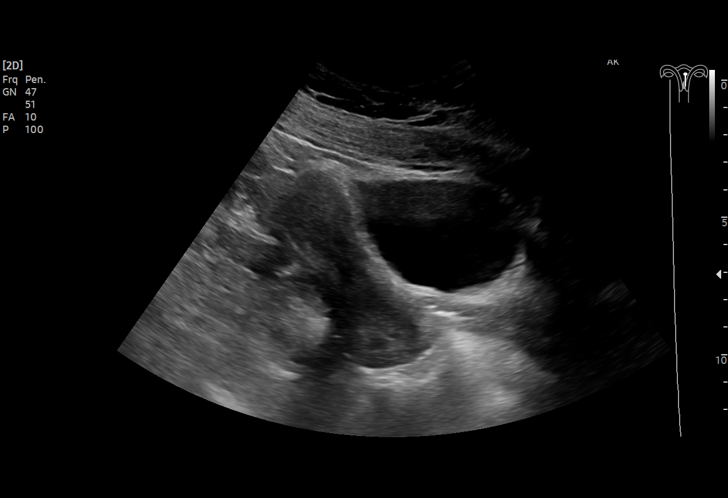
[im 9/103]
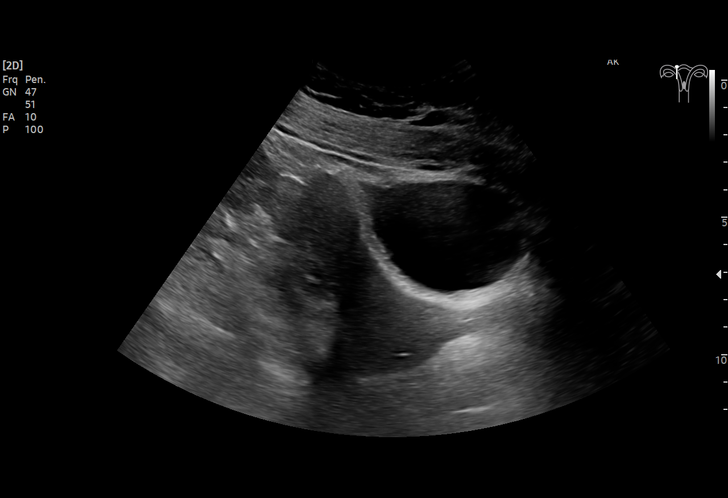
[im 18/103]
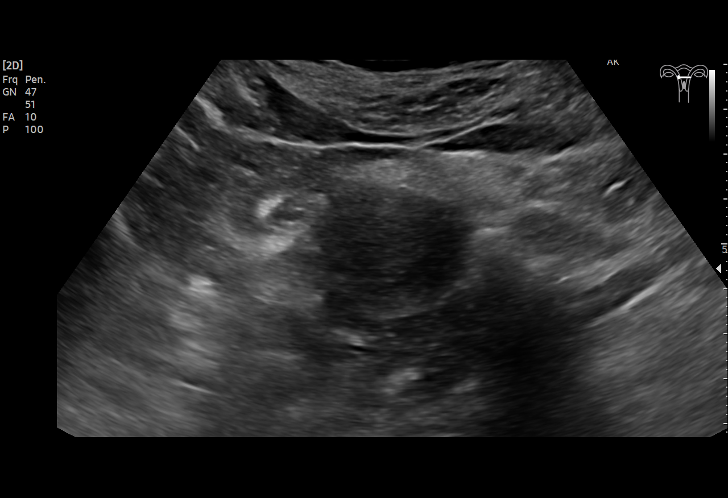
[im 22/103]
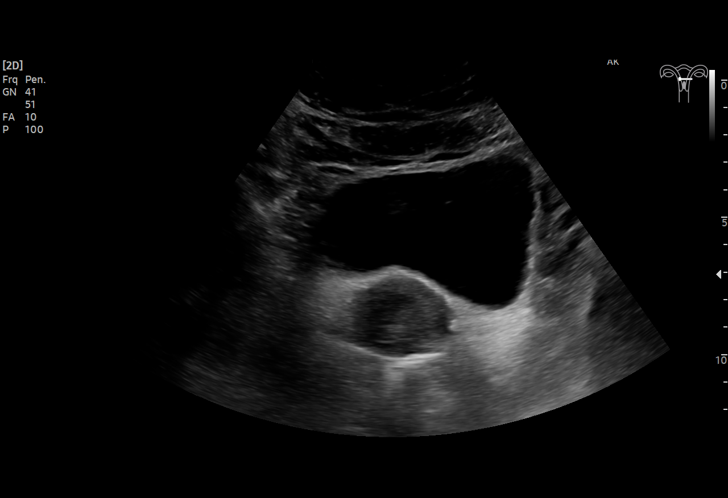
[im 30/103]
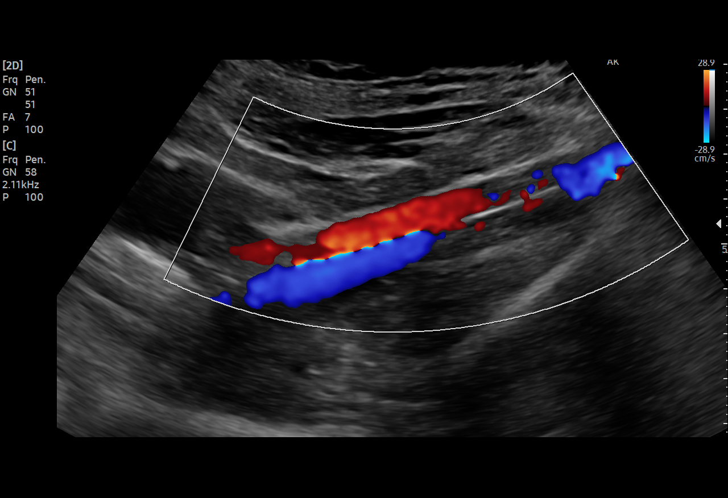
[im 39/103]
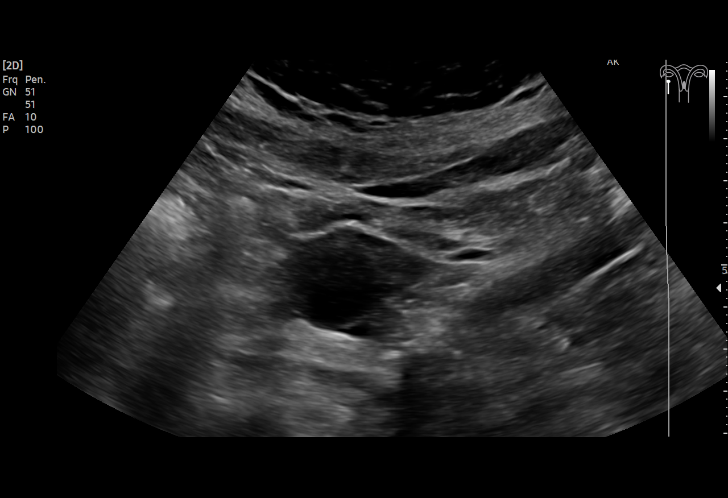
[im 43/103]
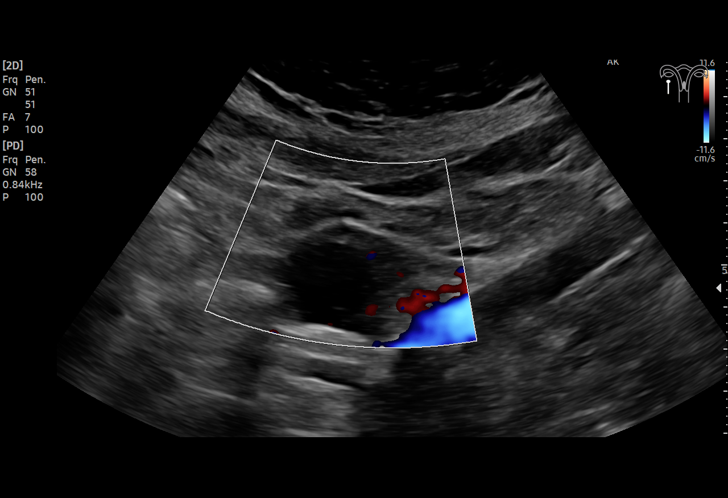
[im 52/103]
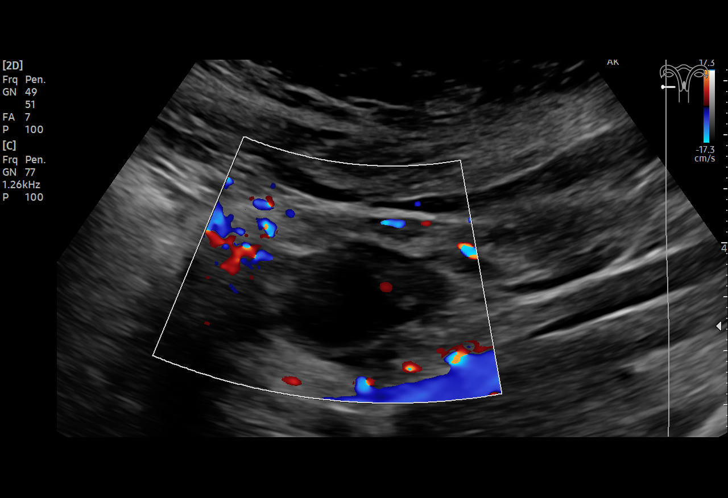
[im 60/103]
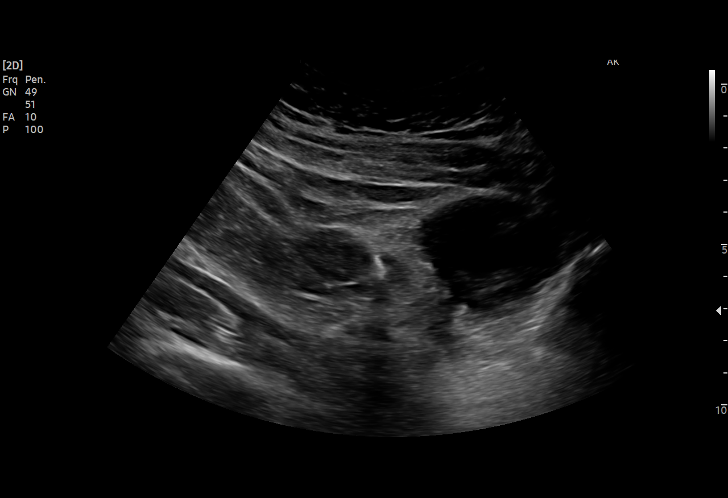
[im 64/103]
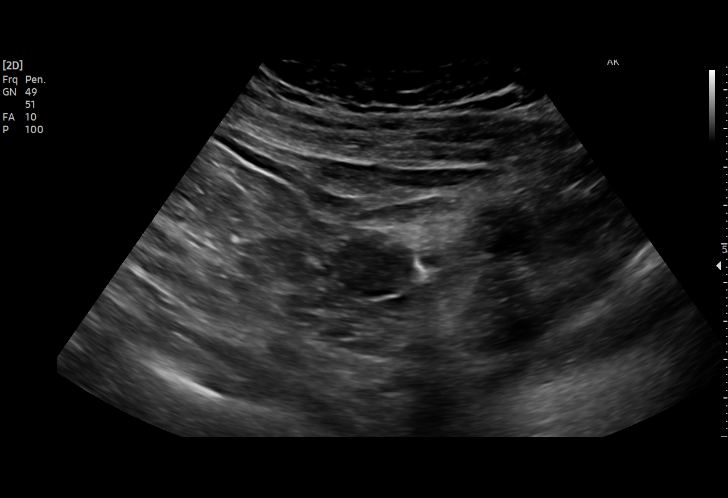
[im 73/103]
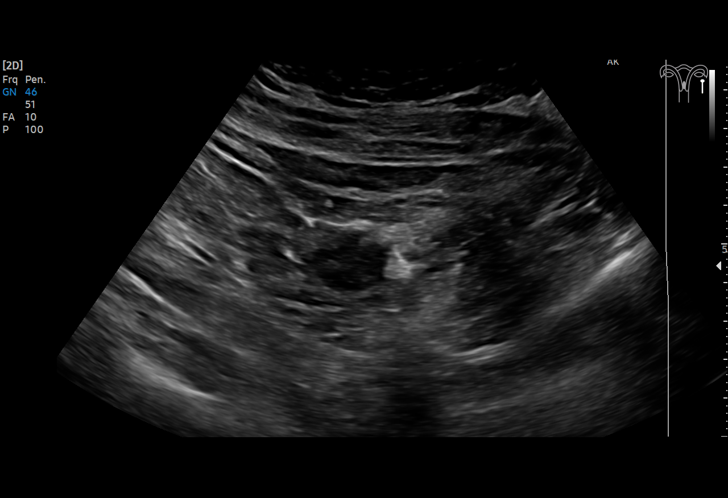
[im 81/103]
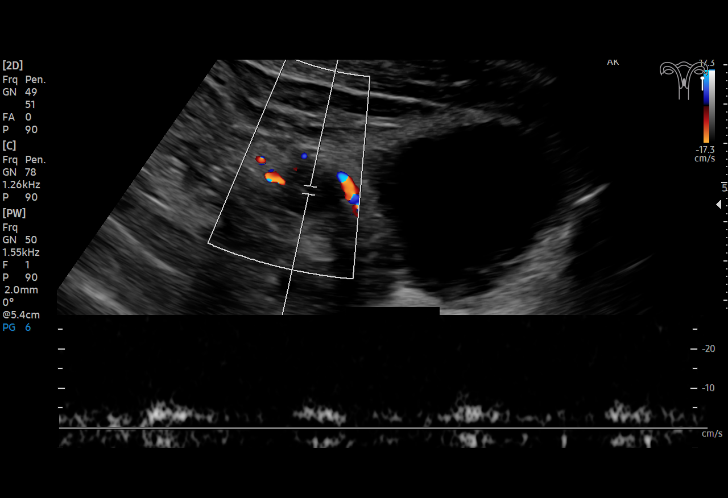
[im 86/103]
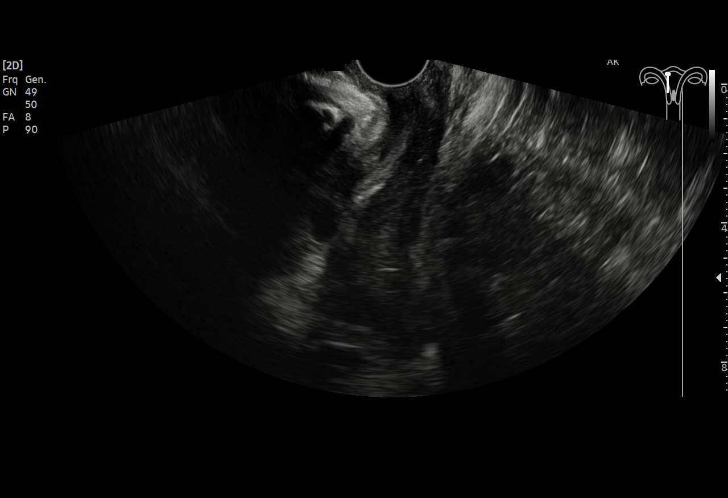
[im 94/103]
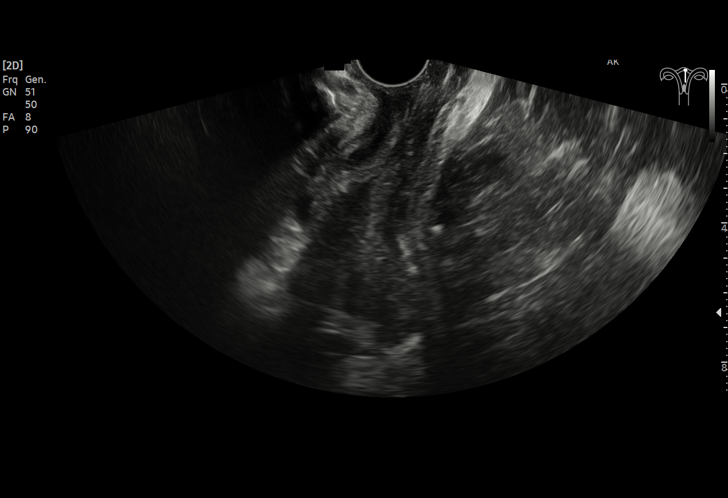
[im 103/103]
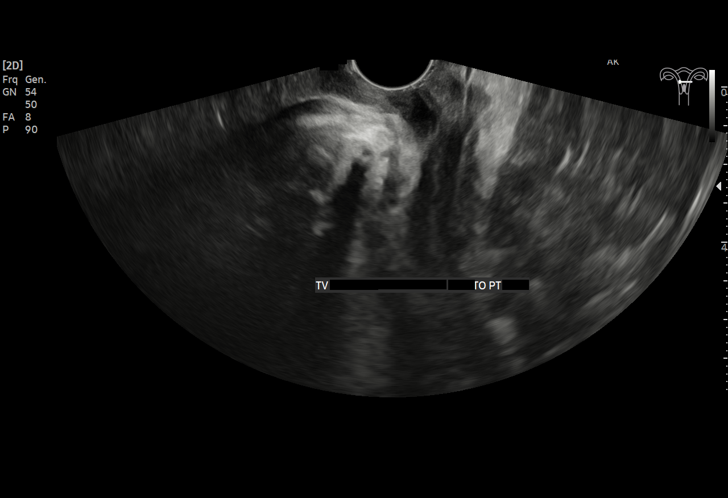

[15 of 25 positions shown; findings below may reference images not displayed]

FINDINGS: Uterus

Measurements: 7.9 x 2.5 x 3.9 cm = volume: 39.7 mL. No fibroids or
other mass visualized.

Endometrium

Thickness: 9 mm.  No focal abnormality visualized.

Right ovary

Measurements: 3.1 x 2.3 x 3.1 cm = volume: 11.7 mL. Normal
appearance dominant follicle noted.

Left ovary

Measurements: 2.2 x 1.8 x 1.5 cm = volume: 3.1 mL. Normal
appearance/no adnexal mass.

Pulsed Doppler evaluation of both ovaries demonstrates normal
low-resistance arterial and venous waveforms.

Other findings

No abnormal free fluid.
IMPRESSION: Normal pelvic ultrasound.  No evidence of ovarian torsion.

## 2023-05-16 ENCOUNTER — Emergency Department: Payer: 59

## 2023-05-16 ENCOUNTER — Other Ambulatory Visit: Payer: Self-pay

## 2023-05-16 ENCOUNTER — Emergency Department
Admission: EM | Admit: 2023-05-16 | Discharge: 2023-05-17 | Disposition: A | Discharge status: Home / Self Care | Payer: 59 | Attending: Student in an Organized Health Care Education/Training Program | Admitting: Student in an Organized Health Care Education/Training Program

## 2023-05-16 DIAGNOSIS — M79602 Pain in left arm: Secondary | ICD-10-CM | POA: Insufficient documentation

## 2023-05-16 DIAGNOSIS — R9431 Abnormal electrocardiogram [ECG] [EKG]: Secondary | ICD-10-CM | POA: Diagnosis not present

## 2023-05-16 LAB — CBC WITH DIFFERENTIAL/PLATELET
Abs Immature Granulocytes: 0.02 10*3/uL (ref 0.00–0.07)
Basophils Absolute: 0.1 10*3/uL (ref 0.0–0.1)
Basophils Relative: 1 %
Eosinophils Absolute: 0.1 10*3/uL (ref 0.0–0.5)
Eosinophils Relative: 1 %
HCT: 43.6 % (ref 36.0–46.0)
Hemoglobin: 14 g/dL (ref 12.0–15.0)
Immature Granulocytes: 0 %
Lymphocytes Relative: 31 %
Lymphs Abs: 2.7 10*3/uL (ref 0.7–4.0)
MCH: 27 pg (ref 26.0–34.0)
MCHC: 32.1 g/dL (ref 30.0–36.0)
MCV: 84.2 fL (ref 80.0–100.0)
Monocytes Absolute: 0.8 10*3/uL (ref 0.1–1.0)
Monocytes Relative: 10 %
Neutro Abs: 5 10*3/uL (ref 1.7–7.7)
Neutrophils Relative %: 57 %
Platelets: 246 10*3/uL (ref 150–400)
RBC: 5.18 MIL/uL — ABNORMAL HIGH (ref 3.87–5.11)
RDW: 12 % (ref 11.5–15.5)
WBC: 8.6 10*3/uL (ref 4.0–10.5)
nRBC: 0 % (ref 0.0–0.2)

## 2023-05-16 LAB — BASIC METABOLIC PANEL
Anion gap: 11 (ref 5–15)
BUN: 11 mg/dL (ref 6–20)
CO2: 25 mmol/L (ref 22–32)
Calcium: 8.9 mg/dL (ref 8.9–10.3)
Chloride: 103 mmol/L (ref 98–111)
Creatinine, Ser: 0.66 mg/dL (ref 0.44–1.00)
GFR, Estimated: >60 mL/min (ref >60)
Glucose, Bld: 102 mg/dL — ABNORMAL HIGH (ref 70–99)
Potassium: 4 mmol/L (ref 3.5–5.1)
Sodium: 139 mmol/L (ref 135–145)

## 2023-05-16 LAB — TROPONIN I (HIGH SENSITIVITY): Troponin I (High Sensitivity): <2 ng/L (ref <18)

## 2023-05-16 NOTE — ED Triage Notes (Signed)
Pt presents to ER with c/o left arm pain that started this morning while driving to work.  Pt states she has a BC implant in her left arm that she is getting removed tomorrow, and is worried about possible blood clots in her arm.  Pt denies any redness, swelling in her left arm.  Denies any neck/chest pain.  States the pain feels like "growing pains almost."  Pt is otherwise A&O x4 and in NAD at this time.

## 2023-05-16 NOTE — ED Provider Notes (Signed)
[facility] Provider Note    Event Date/Time   First MD Initiated Contact with Patient 05/16/23 2027     (approximate)   History   Arm Pain   HPI  Jessica Aguilar is a 37 y.o. female who presents to the ER for evaluation of concern for DVT left arm pain.  Symptoms started today while she was driving to work.  Does have Implanon in the left upper extremity.  Called OB to have it removed tomorrow.  Also feeling very anxious stopped her Lexapro tonight after discussion with her PCP.  Denies any chest pain or pressure but does have family history of heart disease.  No shortness of breath.     Physical Exam   Triage Vital Signs: ED Triage Vitals  Encounter Vitals Group     BP 05/16/23 2012 (!) 121/90     Systolic BP Percentile --      Diastolic BP Percentile --      Pulse Rate 05/16/23 2012 89     Resp 05/16/23 2012 17     Temp 05/16/23 2012 98.3 F (36.8 C)     Temp Source 05/16/23 2012 Oral     SpO2 05/16/23 2012 97 %     Weight 05/16/23 2015 198 lb 6.6 oz (90 kg)     Height 05/16/23 2015 5\' 7"  (1.702 m)     Head Circumference --      Peak Flow --      Pain Score 05/16/23 2015 7     Pain Loc --      Pain Education --      Exclude from Growth Chart --     Most recent vital signs: Vitals:   05/16/23 2012  BP: (!) 121/90  Pulse: 89  Resp: 17  Temp: 98.3 F (36.8 C)  SpO2: 97%     Constitutional: Alert  Eyes: Conjunctivae are normal.  Head: Atraumatic. Nose: No congestion/rhinnorhea. Mouth/Throat: Mucous membranes are moist.   Neck: Painless ROM.  Cardiovascular:   Good peripheral circulation. Respiratory: Normal respiratory effort.  No retractions.  Gastrointestinal: Soft and nontender.  Musculoskeletal:  no deformity Neurologic:  MAE spontaneously. No gross focal neurologic deficits are appreciated.  Skin:  Skin is warm, dry and intact. No rash noted.     ED Results / Procedures / Treatments   Labs (all labs ordered are  listed, but only abnormal results are displayed) Labs Reviewed  CBC WITH DIFFERENTIAL/PLATELET - Abnormal; Notable for the following components:      Result Value   RBC 5.18 (*)    All other components within normal limits  BASIC METABOLIC PANEL - Abnormal; Notable for the following components:   Glucose, Bld 102 (*)    All other components within normal limits  TROPONIN I (HIGH SENSITIVITY)     EKG  ED ECG REPORT I, Willy Eddy, the attending physician, personally viewed and interpreted this ECG.   Date: 05/16/2023  EKG Time: 21:05  Rate: 80  Rhythm: sinus  Axis: normal  Intervals: normal  ST&T Change: no stemi, no depression    RADIOLOGY Please see ED Course for my review and interpretation.  I personally reviewed all radiographic images ordered to evaluate for the above acute complaints and reviewed radiology reports and findings.  These findings were personally discussed with the patient.  Please see medical record for radiology report.    PROCEDURES:  Critical Care performed:   Procedures   MEDICATIONS ORDERED IN ED: Medications - No  data to display   IMPRESSION / MDM / ASSESSMENT AND PLAN / ED COURSE  I reviewed the triage vital signs and the nursing notes.                              Differential diagnosis includes, but is not limited to, DVT, musculoskeletal strain, electrolyte abnormality, ACS, dysrhythmia  Patient presenting to the ER for evaluation of symptoms as described above.  Based on symptoms, risk factors and considered above differential, this presenting complaint could reflect a potentially life-threatening illness therefore the patient will be placed on continuous pulse oximetry and telemetry for monitoring.  Laboratory evaluation will be sent to evaluate for the above complaints.      Clinical Course as of 05/16/23 2343  Wed May 16, 2023  2340 Patient reassessed.  Remains well-appearing in no acute distress.  Ultrasound on my  review and interpretation without evidence of DVT.  Patient be signed out to oncoming physician pending follow-up ultrasound.  Anticipate discharge home. [PR]    Clinical Course User Index [PR] Willy Eddy, MD     FINAL CLINICAL IMPRESSION(S) / ED DIAGNOSES   Final diagnoses:  Left arm pain     Rx / DC Orders   ED Discharge Orders     None        Note:  This document was prepared using Dragon voice recognition software and may include unintentional dictation errors.    Willy Eddy, MD 05/16/23 [phone]

## 2023-05-16 NOTE — Discharge Instructions (Signed)
Your ultrasound does not show any evidence of blood clot.  Your blood work does not show any evidence of heart attack.  Your exam is reassuring.  Follow-up with her OB for Nexplanon removal.  Return to the ER if you have any worsening symptoms questions or concerns.

## 2023-05-17 ENCOUNTER — Ambulatory Visit (INDEPENDENT_AMBULATORY_CARE_PROVIDER_SITE_OTHER): Payer: 59 | Admitting: Certified Nurse Midwife

## 2023-05-17 ENCOUNTER — Encounter: Payer: Self-pay | Admitting: Certified Nurse Midwife

## 2023-05-17 VITALS — BP 123/77 | HR 88 | Ht 67.0 in | Wt 196.0 lb

## 2023-05-17 DIAGNOSIS — Z3046 Encounter for surveillance of implantable subdermal contraceptive: Secondary | ICD-10-CM | POA: Diagnosis not present

## 2023-05-17 NOTE — ED Notes (Signed)
..  The patient is A&OX4, ambulatory at d/c with independent steady gait, NAD. Pt verbalized understanding of d/c instructions and follow up care.

## 2023-05-17 NOTE — Progress Notes (Signed)
    GYNECOLOGY PROCEDURE NOTE  Nexplanon removal discussed in detail.  Risks of infection, bleeding, nerve injury all reviewed.  Patient understands risks and desires to proceed.  Verbal consent obtained.  Patient is certain she wants the Nexplanon removed.  All questions answered.  Review of Systems  Constitutional: Negative.   Respiratory: Negative.    Cardiovascular: Negative.   Musculoskeletal:        Left arm pain    Vital Signs: BP 123/77   Pulse 88   Ht 5\' 7"  (1.702 m)   Wt 196 lb (88.9 kg)   BMI 30.70 kg/m  Constitutional: Well nourished, well developed female in no acute distress.  Skin: Warm and dry.  Cardiovascular: Regular rate   Extremity:  left arm without evidence of injury or edema   Respiratory:  Normal respiratory effort Psych: Alert and Oriented x3. No memory deficits. Normal mood and affect.    Procedure: Patient placed in dorsal supine with left arm above head, elbow flexed at 90 degrees, arm resting on examination table.  Nexplanon identified without problems.  Betadine scrub x3.  1 ml of 1% lidocaine injected under Nexplanon device without problems.  Sterile gloves applied.  Small 0.5cm incision made at distal tip of Nexplanon device with 11 blade scalpel.  Nexplanon brought to incision and grasped with a small kelly clamp.  Nexplanon removed intact without problems.  Pressure applied to incision.  Hemostasis obtained.  Steri-strips applied, followed by bandage and compression dressing.  Patient tolerated procedure well.  No complications.   Assessment: 37 y.o. year old female now s/p uncomplicated Nexplanon removal.  Plan: 1.  Patient given post procedure precautions and asked to call for fever, chills, redness or drainage from her incision, bleeding from incision.  She understands she will likely have a small bruise near site of removal and can remove bandage tomorrow and steri-strips in approximately 1 week.  2) Contraception: condoms   Dominica Severin, CNM

## 2023-06-02 DIAGNOSIS — F39 Unspecified mood [affective] disorder: Secondary | ICD-10-CM | POA: Diagnosis not present

## 2023-06-02 DIAGNOSIS — F411 Generalized anxiety disorder: Secondary | ICD-10-CM | POA: Diagnosis not present

## 2023-06-02 DIAGNOSIS — F901 Attention-deficit hyperactivity disorder, predominantly hyperactive type: Secondary | ICD-10-CM | POA: Diagnosis not present

## 2023-06-22 ENCOUNTER — Telehealth: Payer: Self-pay

## 2023-06-22 NOTE — Telephone Encounter (Signed)
Transition Care Management Unsuccessful Follow-up Telephone Call  Date of discharge and from where:  [place] 9/26  Attempts:  1st Attempt  Reason for unsuccessful TCM follow-up call:  No answer/busy   Lenard Forth [place]  [facility], [facility] Guide, Phone: [phone] Website: [email or web address]

## 2023-06-25 ENCOUNTER — Telehealth: Payer: Self-pay

## 2023-06-25 NOTE — Telephone Encounter (Signed)
Transition Care Management Unsuccessful Follow-up Telephone Call  Date of discharge and from where:  [place] 9/26  Attempts:  2nd Attempt  Reason for unsuccessful TCM follow-up call:  No answer/busy   Jessica Aguilar [place]  [facility], [facility] Guide, Phone: [phone] Website: [email or web address]

## 2023-08-10 NOTE — Progress Notes (Unsigned)
    GYNECOLOGY PROGRESS NOTE  Subjective:    Patient ID: Jessica Aguilar, female    DOB: Oct 17, 1985, 37 y.o.   MRN: [id number]  HPI  Patient is a 37 y.o. [id number] female who presents for evaluation of pelvic pain x 1 month. She has been experiencing cramping and stretching. She has a family history of endometriosis and she has a history of cramps. Nothing relieves the pain. Patient's last menstrual period was 07/16/2023. She had an ultrasound done on 09/01/2021. She has vaginal odor, thinks that she has bacterial vaginosis. Last year  her pelvic ultrasound was completely normal.She is not using any hormonal birth control, but previously has tried the Depo shot and Nexplanon. She was unhappy with both methods.She has been partnered for 7 years and they general,ly do not use BC, but she has not been pregnant.  She is not interested in a pregnancy now, but mentions that in two years she might be interested.  In reviewing her menses, her cycles are now regular and last 4 days.She believes that her period is starting today or in a day r so, and describes the lower abdominal pain as being suprapubic and in the area of her uterus.    The following portions of the patient's history were reviewed and updated as appropriate: allergies, current medications, past family history, past medical history, past social history, past surgical history, and problem list.  Review of Systems Pertinent items noted in HPI and remainder of comprehensive ROS otherwise negative.   Objective:   There were no vitals taken for this visit. There is no height or weight on file to calculate BMI. General appearance: alert, cooperative, and no distress Abdomen: soft, non-tender; bowel sounds normal; no masses,  no organomegaly Pelvic: cervix normal in appearance, external genitalia normal, no adnexal masses or tenderness, no bladder tenderness, no cervical motion tenderness, perianal skin: no external genital warts noted,  rectovaginal septum normal, uterus normal size, shape, and consistency, and vagina normal without discharge Extremities: extremities normal, atraumatic, no cyanosis or edema Neurologic: Grossly normal   Assessment:   Concern for pelvic pain Irregular use of contraception, but does not desire a pregnancy   Plan:   We discussed her concern that " family members have endometriosis" and in reviwin gher cycles, there are no findings that would  support this diagnosis for her. I carefully reviewed endometriosis and dysmenorrhea, as she c/o cramping and "stretching"  as her complaint. Hr speculum exam and pelvic exam are completely normal, and I did not elicit any pelvic pain with the bimanual. I offered her another ultrasound, which she declined today  As she does not want to be pregnant at this time,. I discussed contraception with her. Ultimately, after reviewing hormonal methods ( in order to give her a regular, lighter cycle, which may address the cramping) she opts for a trial of the Ring. I reviewed the risk and benefits of this method. As she is likely starting period today, she is instructed to place the ring today or tomorrow. Aptima swab retrieved today to check for BV, infection.  She will f/u with a provider  in a few months once she has used this method over several cycles.  I have discussed her age and the issues she might face in achieving a pregnancy as she reaches her late 35s.  Mirna Mires, CNM  08/13/2023 1:45 PM     Paula Compton, CNM [facility]

## 2023-08-13 ENCOUNTER — Other Ambulatory Visit (HOSPITAL_COMMUNITY)
Admission: RE | Admit: 2023-08-13 | Discharge: 2023-08-13 | Disposition: A | Discharge status: Home / Self Care | Payer: 59 | Source: Ambulatory Visit | Attending: Obstetrics | Admitting: Obstetrics

## 2023-08-13 ENCOUNTER — Ambulatory Visit (INDEPENDENT_AMBULATORY_CARE_PROVIDER_SITE_OTHER): Payer: 59 | Admitting: Obstetrics

## 2023-08-13 VITALS — BP 103/71 | HR 79 | Wt 207.7 lb

## 2023-08-13 DIAGNOSIS — R102 Pelvic and perineal pain: Secondary | ICD-10-CM

## 2023-08-13 DIAGNOSIS — Z3009 Encounter for other general counseling and advice on contraception: Secondary | ICD-10-CM

## 2023-08-13 DIAGNOSIS — N898 Other specified noninflammatory disorders of vagina: Secondary | ICD-10-CM | POA: Diagnosis present

## 2023-08-13 MED ORDER — ETONOGESTREL-ETHINYL ESTRADIOL 0.12-0.015 MG/24HR VA RING: VAGINAL_RING | VAGINAL | 4 refills | Status: DC

## 2023-08-14 LAB — CERVICOVAGINAL ANCILLARY ONLY
Bacterial Vaginitis (gardnerella): NEGATIVE
Candida Glabrata: NEGATIVE
Candida Vaginitis: NEGATIVE
Chlamydia: NEGATIVE
Comment: NEGATIVE
Comment: NEGATIVE
Comment: NEGATIVE
Comment: NEGATIVE
Comment: NEGATIVE
Comment: NORMAL
Neisseria Gonorrhea: NEGATIVE
Trichomonas: NEGATIVE

## 2023-08-17 ENCOUNTER — Encounter: Payer: Self-pay | Admitting: Obstetrics

## 2023-08-25 ENCOUNTER — Telehealth: Payer: 59 | Admitting: Nurse Practitioner

## 2023-08-25 DIAGNOSIS — G44319 Acute post-traumatic headache, not intractable: Secondary | ICD-10-CM

## 2023-08-25 NOTE — Progress Notes (Signed)
 Virtual Visit Consent   Jessica Aguilar, you are scheduled for a virtual visit with a [facility] provider today. Just as with appointments in the office, your consent must be obtained to participate. Your consent will be active for this visit and any virtual visit you may have with one of our providers in the next 365 days. If you have a MyChart account, a copy of this consent can be sent to you electronically.  As this is a virtual visit, video technology does not allow for your provider to perform a traditional examination. This may limit your provider's ability to fully assess your condition. If your provider identifies any concerns that need to be evaluated in person or the need to arrange testing (such as labs, EKG, etc.), we will make arrangements to do so. Although advances in technology are sophisticated, we cannot ensure that it will always work on either your end or our end. If the connection with a video visit is poor, the visit may have to be switched to a telephone visit. With either a video or telephone visit, we are not always able to ensure that we have a secure connection.  By engaging in this virtual visit, you consent to the provision of healthcare and authorize for your insurance to be billed (if applicable) for the services provided during this visit. Depending on your insurance coverage, you may receive a charge related to this service.  I need to obtain your verbal consent now. Are you willing to proceed with your visit today? Jessica Aguilar has provided verbal consent on 08/25/2023 for a virtual visit (video or telephone). Jessica LELON Servant, NP  Date: 08/25/2023 7:30 PM  Virtual Visit via Video Note   I, Jessica Aguilar, connected with  Jessica Aguilar  ([id number], May 28, 1986) on 08/25/23 at  7:15 PM EST by a video-enabled telemedicine application and verified that I am speaking with the correct person using two identifiers.  Location: Patient: Virtual Visit Location Patient:  Home Provider: Virtual Visit Location Provider: Home Office   I discussed the limitations of evaluation and management by telemedicine and the availability of in person appointments. The patient expressed understanding and agreed to proceed.    History of Present Illness: Jessica Aguilar is a 38 y.o. who identifies as a female who was assigned female at birth, and is being seen today for concussion.  Jessica Aguilar states she was laughing and hit the back of her head on the arm of her chair today. She had symptoms earlier of dizziness and nausea which have subsided. Wants to make sure she is okay. Does not endorse visual changes, slurred speech, weakness, numbness, gait imbalance, loss of consciousness, confusion, lethargy.  She does endorse tenderness in the back of the head.     Problems: There are no active problems to display for this patient.   Allergies:  Allergies  Allergen Reactions   Amoxicillin Hives and Rash   Amoxicillin Hives   Medications:  Current Outpatient Medications:    albuterol  (VENTOLIN  HFA) 108 (90 Base) MCG/ACT inhaler, Inhale into the lungs., Disp: , Rfl:    etonogestrel -ethinyl estradiol  (NUVARING) 0.12-0.015 MG/24HR vaginal ring, Insert vaginally and leave in place for 3 consecutive weeks, then remove for 1 week., Disp: 3 each, Rfl: 4  Observations/Objective: Patient is well-developed, well-nourished in no acute distress.  Resting comfortably at home.  Head is normocephalic, atraumatic.  No labored breathing.  Speech is clear and coherent with logical content.  Patient is alert and oriented at baseline.  Assessment and Plan: 1. Acute post-traumatic headache, not intractable (Primary) Monitor symptoms if progression of any symptoms that we discussed will need to follow up urgently with ER.    Follow Up Instructions: I discussed the assessment and treatment plan with the patient. The patient was provided an opportunity to ask questions and all were answered.  The patient agreed with the plan and demonstrated an understanding of the instructions.  A copy of instructions were sent to the patient via MyChart unless otherwise noted below.    The patient was advised to call back or seek an in-person evaluation if the symptoms worsen or if the condition fails to improve as anticipated.    Jessica Gentles W Jessica Giacobbe, NP

## 2023-08-25 NOTE — Patient Instructions (Signed)
  Lonell Kitty, thank you for joining Haze LELON Servant, NP for today's virtual visit.  While this provider is not your primary care provider (PCP), if your PCP is located in our provider database this encounter information will be shared with them immediately following your visit.   A [facility] MyChart account gives you access to today's visit and all your visits, tests, and labs performed at [facility]  click here if you don't have a [facility] MyChart account or go to [email or web address]  Consent: (Patient) Jessica Aguilar provided verbal consent for this virtual visit at the beginning of the encounter.  Current Medications:  Current Outpatient Medications:    albuterol  (VENTOLIN  HFA) 108 (90 Base) MCG/ACT inhaler, Inhale into the lungs., Disp: , Rfl:    etonogestrel -ethinyl estradiol  (NUVARING) 0.12-0.015 MG/24HR vaginal ring, Insert vaginally and leave in place for 3 consecutive weeks, then remove for 1 week., Disp: 3 each, Rfl: 4   Medications ordered in this encounter:  No orders of the defined types were placed in this encounter.    *If you need refills on other medications prior to your next appointment, please contact your pharmacy*  Follow-Up: Call back or seek an in-person evaluation if the symptoms worsen or if the condition fails to improve as anticipated.  [facility] [phone]  Other Instructions Monitor symptoms if progression of any symptoms that we discussed will need to follow up urgently with ER.    If you have been instructed to have an in-person evaluation today at a local Urgent Care facility, please use the link below. It will take you to a list of all of our available [facility], including address, phone number and hours of operation. Please do not delay care.  [facility]  If you or a family member do not have a primary care provider, use the link below to schedule a visit and establish care. When  you choose a [facility] primary care physician or advanced practice provider, you gain a long-term partner in health. Find a Primary Care Provider  Learn more about [facility]'s in-office and virtual care options: [facility] - Get Care Now

## 2023-09-13 ENCOUNTER — Telehealth: Payer: Self-pay

## 2023-09-13 NOTE — Telephone Encounter (Signed)
Patient advised she saw Paula Compton 08/13/23 and received rx for etonogestrel-ethinyl estradiol (NUVARING) 0.12-0.015 MG/24HR vaginal ring . She needs a refill. Advised a full year rx was sent/received by her pharmacy. Patient advised to contact pharmacy to request refill.

## 2024-07-01 ENCOUNTER — Encounter (HOSPITAL_COMMUNITY): Payer: Self-pay | Admitting: Pharmacist

## 2024-07-01 ENCOUNTER — Other Ambulatory Visit (HOSPITAL_COMMUNITY): Payer: Self-pay

## 2024-07-15 ENCOUNTER — Telehealth: Admitting: Physician Assistant

## 2024-07-15 NOTE — Progress Notes (Signed)
 Message sent to patient requesting further input regarding current symptoms. Awaiting patient response.

## 2024-08-01 NOTE — Progress Notes (Signed)
°  Because [place], I feel your condition warrants further evaluation and I recommend that you be seen in a face-to-face visit. You can see urgent care or you can schedule an apptmt with a pcp online on [employer].    NOTE: There will be NO CHARGE for this E-Visit   If you are having a true medical emergency, please call 911.     For an urgent face to face visit, [facility] has multiple urgent care centers for your convenience.  Click the link below for the full list of locations and hours, walk-in wait times, appointment scheduling options and driving directions:  Urgent Care - [place], [place], [place], [place], [place], [place]  [place]     Your MyChart E-visit questionnaire answers were reviewed by a board certified advanced clinical practitioner to complete your personal care plan based on your specific symptoms.    Thank you for using e-Visits.

## 2024-08-12 ENCOUNTER — Ambulatory Visit
Admission: EM | Admit: 2024-08-12 | Discharge: 2024-08-12 | Disposition: A | Discharge status: Home / Self Care | Attending: Emergency Medicine | Admitting: Emergency Medicine

## 2024-08-12 DIAGNOSIS — B349 Viral infection, unspecified: Secondary | ICD-10-CM | POA: Diagnosis not present

## 2024-08-12 LAB — POC SOFIA SARS ANTIGEN FIA: SARS Coronavirus 2 Ag: NEGATIVE

## 2024-08-12 LAB — POCT INFLUENZA A/B
Influenza A, POC: NEGATIVE
Influenza B, POC: NEGATIVE

## 2024-08-12 NOTE — Discharge Instructions (Addendum)
 The COVID and flu tests are negative.   Take Tylenol  or ibuprofen as needed for fever or discomfort.  Take plain Mucinex as needed for congestion.  Rest and keep yourself hydrated.    Follow up with your primary care provider.  Go to the emergency department if you have worsening symptoms.

## 2024-08-12 NOTE — ED Provider Notes (Signed)
 " CAY RALPH PELT    CSN: [id number] Arrival date & time: 08/12/24  1054      History   Chief Complaint Chief Complaint  Patient presents with   Cough   Nasal Congestion    HPI Jessica Aguilar is a 38 y.o. female.  Patient presents with bodyaches, congestion, cough since last night.  No fever, shortness of breath, vomiting, diarrhea.  No OTC medications taken today.  Her medical history includes anxiety, depression, ADHD.  The history is provided by the patient and medical records.    Past Medical History:  Diagnosis Date   ADHD    Anxiety    Depression    Fracture of elbow with routine healing, right    Fracture of toe of left foot    Fracture of wrist    History of anemia    History of urinary tract infection    Ovarian cyst     There are no active problems to display for this patient.   Past Surgical History:  Procedure Laterality Date   DENTAL SURGERY Bilateral    wisdom tooth removal x4      OB History     Gravida  1   Para      Term      Preterm      AB  1   Living         SAB  1   IAB  0   Ectopic      Multiple      Live Births               Home Medications    Prior to Admission medications  Medication Sig Start Date End Date Taking? Authorizing Provider  albuterol  (VENTOLIN  HFA) 108 (90 Base) MCG/ACT inhaler Inhale into the lungs. 01/24/23 01/24/24  [provider]  etonogestrel -ethinyl estradiol  (NUVARING) 0.12-0.015 MG/24HR vaginal ring Insert vaginally and leave in place for 3 consecutive weeks, then remove for 1 week. Patient not taking: Reported on 08/12/2024 08/13/23   Carlin Rollene HERO, CNM    Family History Family History  Problem Relation Age of Onset   Diabetes Father    Kidney disease Father    Hypertension Father    COPD Father    Heart failure Maternal Grandmother    Arthritis Maternal Grandmother    Depression Paternal Grandmother    Breast cancer Paternal Grandmother    Diabetes Paternal  Grandfather    Lung disease Maternal Grandfather    Endocrine tumor Sister     Social History Social History[1]   Allergies   Amoxicillin and Amoxicillin   Review of Systems Review of Systems  Constitutional:  Negative for chills and fever.  HENT:  Positive for congestion. Negative for ear pain and sore throat.   Respiratory:  Positive for cough. Negative for shortness of breath.   Gastrointestinal:  Negative for diarrhea and vomiting.     Physical Exam Triage Vital Signs ED Triage Vitals  Encounter Vitals Group     BP 08/12/24 1237 134/88     Girls Systolic BP Percentile --      Girls Diastolic BP Percentile --      Boys Systolic BP Percentile --      Boys Diastolic BP Percentile --      Pulse Rate 08/12/24 1237 86     Resp 08/12/24 1237 18     Temp 08/12/24 1237 98.6 F (37 C)     Temp src --  SpO2 08/12/24 1237 97 %     Weight --      Height --      Head Circumference --      Peak Flow --      Pain Score 08/12/24 1238 6     Pain Loc --      Pain Education --      Exclude from Growth Chart --    No data found.  Updated Vital Signs BP 134/88   Pulse 86   Temp 98.6 F (37 C)   Resp 18   LMP 07/17/2024   SpO2 97%   Visual Acuity Right Eye Distance:   Left Eye Distance:   Bilateral Distance:    Right Eye Near:   Left Eye Near:    Bilateral Near:     Physical Exam Constitutional:      General: She is not in acute distress.    Appearance: She is ill-appearing.  HENT:     Right Ear: Tympanic membrane normal.     Left Ear: Tympanic membrane normal.     Nose: Nose normal.     Mouth/Throat:     Mouth: Mucous membranes are moist.     Pharynx: Oropharynx is clear.  Cardiovascular:     Rate and Rhythm: Normal rate and regular rhythm.     Heart sounds: Normal heart sounds.  Pulmonary:     Effort: Pulmonary effort is normal. No respiratory distress.     Breath sounds: Normal breath sounds.  Neurological:     Mental Status: She is alert.       UC Treatments / Results  Labs (all labs ordered are listed, but only abnormal results are displayed) Labs Reviewed  POCT INFLUENZA A/B  POC SOFIA SARS ANTIGEN FIA    EKG   Radiology No results found.  Procedures Procedures (including critical care time)  Medications Ordered in UC Medications - No data to display  Initial Impression / Assessment and Plan / UC Course  I have reviewed the triage vital signs and the nursing notes.  Pertinent labs & imaging results that were available during my care of the patient were reviewed by me and considered in my medical decision making (see chart for details).    Viral illness.  Afebrile and vital signs are stable.  Lungs are clear and O2 sat is 97% on room air.  Rapid COVID and flu negative.  Discussed symptomatic treatment including Tylenol or ibuprofen  as needed for fever or discomfort, plain Mucinex as needed for congestion, rest, hydration.  Instructed patient to follow-up with her PCP.  ED precautions given.  Patient agrees to plan of care.  Final Clinical Impressions(s) / UC Diagnoses   Final diagnoses:  Viral illness     Discharge Instructions      The COVID and flu tests are negative.   Take Tylenol or ibuprofen  as needed for fever or discomfort.  Take plain Mucinex as needed for congestion.  Rest and keep yourself hydrated.    Follow up with your primary care provider.  Go to the emergency department if you have worsening symptoms.        ED Prescriptions   None    PDMP not reviewed this encounter.    [1]  Social History Tobacco Use   Smoking status: Never   Smokeless tobacco: Never  Vaping Use   Vaping status: Former   Substances: Nicotine  Substance Use Topics   Alcohol use: Yes    Comment: occasional  Drug use: Not Currently    Types: Marijuana    Comment: Last marijuana 6 years ago.     Corlis Burnard DEL, NP 08/12/24 1312  "

## 2024-08-12 NOTE — ED Triage Notes (Addendum)
 Patient to Urgent Care with complaints of chest congestion/ cough/ body aches. Denies any fevers.  Symptoms started last night.   Using a cough syrup. Albuterol  inhaler is expired.

## 2024-09-07 NOTE — Progress Notes (Unsigned)
 "  New Patient Note  RE: Jessica Aguilar MRN: [id number] DOB: August 23, 1985 Date of Office Visit: 09/08/2024  Consult requested by: No ref. provider found Primary care provider: Pcp, No  Chief Complaint: No chief complaint on file.  History of Present Illness: I had the pleasure of seeing Jessica Aguilar for initial evaluation at the [facility] on 09/08/2024. She is a 39 y.o. female, who is referred here by No ref. provider found for the evaluation of ***.  Discussed the use of AI scribe software for clinical note transcription with the patient, who gave verbal consent to proceed.  History of Present Illness             ***  Assessment and Plan: Jessica Aguilar is a 39 y.o. female with: ***  Assessment and Plan               No follow-ups on file.  No orders of the defined types were placed in this encounter.  Lab Orders  No laboratory test(s) ordered today    Other allergy  screening: Asthma: {Blank single:19197::yes,no} Rhino conjunctivitis: {Blank single:19197::yes,no} Food allergy : {Blank single:19197::yes,no} Medication allergy : {Blank single:19197::yes,no} Hymenoptera allergy : {Blank single:19197::yes,no} Urticaria: {Blank single:19197::yes,no} Eczema:{Blank single:19197::yes,no} History of recurrent infections suggestive of immunodeficency: {Blank single:19197::yes,no}  Diagnostics: Spirometry:  Tracings reviewed. Her effort: {Blank single:19197::Good reproducible efforts.,It was hard to get consistent efforts and there is a question as to whether this reflects a maximal maneuver.,Poor effort, data can not be interpreted.} FVC: ***L FEV1: ***L, ***% predicted FEV1/FVC ratio: ***% Interpretation: {Blank single:19197::Spirometry consistent with mild obstructive disease,Spirometry consistent with moderate obstructive disease,Spirometry consistent with severe obstructive disease,Spirometry consistent with  possible restrictive disease,Spirometry consistent with mixed obstructive and restrictive disease,Spirometry uninterpretable due to technique,Spirometry consistent with normal pattern,No overt abnormalities noted given today's efforts}.  Please see scanned spirometry results for details.  Skin Testing: {Blank single:19197::Select foods,Environmental allergy  panel,Environmental allergy  panel and select foods,Food allergy  panel,None,Deferred due to recent antihistamines use}. *** Results discussed with patient/family.   Past Medical History: There are no active problems to display for this patient.  Past Medical History:  Diagnosis Date   ADHD    Anxiety    Depression    Fracture of elbow with routine healing, right    Fracture of toe of left foot    Fracture of wrist    History of anemia    History of urinary tract infection    Ovarian cyst    Past Surgical History: Past Surgical History:  Procedure Laterality Date   DENTAL SURGERY Bilateral    wisdom tooth removal x4     Medication List:  Current Outpatient Medications  Medication Sig Dispense Refill   albuterol  (VENTOLIN  HFA) 108 (90 Base) MCG/ACT inhaler Inhale into the lungs.     etonogestrel -ethinyl estradiol  (NUVARING) 0.12-0.015 MG/24HR vaginal ring Insert vaginally and leave in place for 3 consecutive weeks, then remove for 1 week. (Patient not taking: Reported on 08/12/2024) 3 each 4   No current facility-administered medications for this visit.   Allergies: Allergies[1] Social History: Social History   Socioeconomic History   Marital status: Married    Spouse name: Not on file   Number of children: 0   Years of education: 14   Highest education level: High school graduate  Occupational History   Not on file  Tobacco Use   Smoking status: Never   Smokeless tobacco: Never  Vaping Use   Vaping status: Former   Substances: Nicotine  Substance and Sexual Activity  Alcohol use: Yes    Comment: occasional   Drug use: Not Currently    Types: Marijuana    Comment: Last marijuana 6 years ago.   Sexual activity: Yes    Partners: Male    Birth control/protection: None  Other Topics Concern   Not on file  Social History Narrative   ** Merged History Encounter **       Social Drivers of Health   Tobacco Use: Low Risk (08/12/2024)   Patient History    Smoking Tobacco Use: Never    Smokeless Tobacco Use: Never    Passive Exposure: Not on file  Financial Resource Strain: Not on file  Food Insecurity: Not on file  Transportation Needs: Not on file  Physical Activity: Not on file  Stress: Not on file  Social Connections: Not on file  Depression (PHQ2-9): High Risk (11/01/2021)   Depression (PHQ2-9)    PHQ-2 Score: 19  Alcohol Screen: Not on file  Housing: Unknown (10/12/2023)   Received from [facility]   Epic    Unable to Pay for Housing in the Last Year: Not on file    Number of Times Moved in the Last Year: Not on file    At any time in the past 12 months, were you homeless or living in a shelter (including now)?: Patient declined  Utilities: Not on file  Health Literacy: Not on file   Lives in a ***. Smoking: *** Occupation: ***  Environmental HistorySurveyor, Minerals in the house: Network Engineer in the family room: {Blank single:19197::yes,no} Carpet in the bedroom: {Blank single:19197::yes,no} Heating: {Blank single:19197::electric,gas,heat pump} Cooling: {Blank single:19197::central,window,heat pump} Pet: {Blank single:19197::yes ***,no}  Family History: Family History  Problem Relation Age of Onset   Diabetes Father    Kidney disease Father    Hypertension Father    COPD Father    Heart failure Maternal Grandmother    Arthritis Maternal Grandmother    Depression Paternal Grandmother    Breast cancer Paternal Grandmother     Diabetes Paternal Grandfather    Lung disease Maternal Grandfather    Endocrine tumor Sister    Problem                               Relation Asthma                                   *** Eczema                                *** Food allergy                           *** Allergic rhino conjunctivitis     ***  Review of Systems  Constitutional:  Negative for appetite change, chills, fever and unexpected weight change.  HENT:  Negative for congestion and rhinorrhea.   Eyes:  Negative for itching.  Respiratory:  Negative for cough, chest tightness, shortness of breath and wheezing.   Cardiovascular:  Negative for chest pain.  Gastrointestinal:  Negative for abdominal pain.  Genitourinary:  Negative for difficulty urinating.  Skin:  Negative for rash.  Neurological:  Negative for headaches.    Objective: LMP 07/17/2024  There is no height or weight on file to  calculate BMI. Physical Exam Vitals and nursing note reviewed.  Constitutional:      Appearance: Normal appearance. She is well-developed.  HENT:     Head: Normocephalic and atraumatic.     Right Ear: Tympanic membrane and external ear normal.     Left Ear: Tympanic membrane and external ear normal.     Nose: Nose normal.     Mouth/Throat:     Mouth: Mucous membranes are moist.     Pharynx: Oropharynx is clear.  Eyes:     Conjunctiva/sclera: Conjunctivae normal.  Cardiovascular:     Rate and Rhythm: Normal rate and regular rhythm.     Heart sounds: Normal heart sounds. No murmur heard.    No friction rub. No gallop.  Pulmonary:     Effort: Pulmonary effort is normal.     Breath sounds: Normal breath sounds. No wheezing, rhonchi or rales.  Musculoskeletal:     Cervical back: Neck supple.  Skin:    General: Skin is warm.     Findings: No rash.  Neurological:     Mental Status: She is alert and oriented to person, place, and time.  Psychiatric:        Behavior: Behavior normal.   The plan was reviewed with the  patient/family, and all questions/concerned were addressed.  It was my pleasure to see Jessica Aguilar today and participate in her care. Please feel free to contact me with any questions or concerns.  Sincerely,  Orlan Cramp, DO Allergy  & Immunology  [facility]  [facility] office: [phone] [facility] office: [phone]     [1] Allergies Allergen Reactions   Amoxicillin Hives and Rash   Amoxicillin Hives  "

## 2024-09-08 ENCOUNTER — Encounter: Payer: Self-pay | Admitting: Allergy

## 2024-09-08 ENCOUNTER — Ambulatory Visit: Payer: Self-pay | Admitting: Allergy

## 2024-09-08 ENCOUNTER — Other Ambulatory Visit: Payer: Self-pay

## 2024-09-08 VITALS — BP 108/66 | HR 78 | Temp 99.3°F | Resp 12 | Ht 67.5 in | Wt 207.0 lb

## 2024-09-08 DIAGNOSIS — Z91038 Other insect allergy status: Secondary | ICD-10-CM | POA: Diagnosis not present

## 2024-09-08 DIAGNOSIS — J3089 Other allergic rhinitis: Secondary | ICD-10-CM

## 2024-09-08 DIAGNOSIS — Z880 Allergy status to penicillin: Secondary | ICD-10-CM | POA: Diagnosis not present

## 2024-09-08 DIAGNOSIS — J4520 Mild intermittent asthma, uncomplicated: Secondary | ICD-10-CM | POA: Diagnosis not present

## 2024-09-08 MED ORDER — EPINEPHRINE 0.3 MG/0.3ML IJ SOAJ
0.3000 mg | INTRAMUSCULAR | 1 refills | Status: AC | PRN
  Filled 2024-09-08: qty 2, 30d supply, fill #0

## 2024-09-08 NOTE — Patient Instructions (Addendum)
 Stinging insects Continue to avoid. I have prescribed epinephrine  injectable device and demonstrated proper use. For mild symptoms you can take over the counter antihistamines (zyrtec  10mg  to 20mg ) and monitor symptoms closely.  If symptoms worsen or if you have severe symptoms including breathing issues, throat closure, significant swelling, whole body hives, severe diarrhea and vomiting, lightheadedness then use epinephrine  and seek immediate medical care afterwards. Emergency action plan given. Get bloodwork If positive, will recommend to start venom allergy  injections for the stinging insects. If negative, will schedule for skin testing in the [facility] office for stinging insects given your clinical history - this is done once per month.  We are ordering labs, so please allow 1-2 weeks for the results to come back. With the newly implemented Cures Act, the labs might be visible to you at the same time that they become visible to me. However, I will not address the results until all of the results are back, so please be patient.  In the meantime, continue recommendations in your patient instructions, including avoidance measures (if applicable), until you hear from me.  Rhinitis  Get labs. If negative will recommend skin testing next.   Breathing Monitor symptoms. May use albuterol  rescue inhaler 2 puffs or nebulizer every 4 to 6 hours as needed for shortness of breath, chest tightness, coughing, and wheezing.  Monitor frequency of use - if you need to use it more than twice per week on a consistent basis let us  know.   Penicillin allergy : Consider penicillin allergy  skin testing and in office drug challenge in the future.  Over 90% of people with history of penicillin allergy  which occurred over 10 years ago are found to be non-allergic.  You must be off antihistamines for 3-5 days before. Must be in good health and not ill. No vaccines/injections/antibiotics within the past 7 days.  Plan on being in the office for 2-3 hours and must bring in the drug you want to do the oral challenge for - will send in prescription to pick up a few days before. You must call to schedule an appointment and specify it's for a drug challenge.   Follow up depending on lab results.

## 2024-09-11 LAB — HYMENOPTERA VENOM ALLERGY II

## 2024-09-13 ENCOUNTER — Ambulatory Visit: Payer: Self-pay | Admitting: Allergy

## 2024-09-13 LAB — ALLERGENS W/COMP RFLX AREA 2
Alternaria Alternata IgE: <0.1 kU/L
Aspergillus Fumigatus IgE: <0.1 kU/L
Bermuda Grass IgE: <0.1 kU/L
Cedar, Mountain IgE: <0.1 kU/L
Cladosporium Herbarum IgE: <0.1 kU/L
Cockroach, German IgE: <0.1 kU/L
Common Silver Birch IgE: <0.1 kU/L
Cottonwood IgE: <0.1 kU/L
D Farinae IgE: <0.1 kU/L
D Pteronyssinus IgE: <0.1 kU/L
E001-IgE Cat Dander: <0.1 kU/L
E005-IgE Dog Dander: <0.1 kU/L
Elm, American IgE: <0.1 kU/L
IgE (Immunoglobulin E), Serum: 6 [IU]/mL (ref 6–495)
Johnson Grass IgE: <0.1 kU/L
Maple/Box Elder IgE: <0.1 kU/L
Mouse Urine IgE: <0.1 kU/L
Oak, White IgE: <0.1 kU/L
Pecan, Hickory IgE: <0.1 kU/L
Penicillium Chrysogen IgE: <0.1 kU/L
Pigweed, Rough IgE: <0.1 kU/L
Ragweed, Short IgE: <0.1 kU/L
Sheep Sorrel IgE Qn: <0.1 kU/L
Timothy Grass IgE: <0.1 kU/L
White Mulberry IgE: <0.1 kU/L

## 2024-09-13 LAB — HYMENOPTERA VENOM ALLERGY II
Bumblebee: <0.1 kU/L
Hornet, White Face, IgE: <0.1 kU/L
Hornet, Yellow, IgE: <0.1 kU/L
I001-IgE Honeybee: <0.1 kU/L
I003-IgE Yellow Jacket: <0.1 kU/L
I004-IgE Paper Wasp: <0.1 kU/L
I208-IgE Api m 1: <0.1 kU/L
I209-IgE Ves v 5: <0.1 kU/L
I210-IgE Pol d 5: <0.1 kU/L
I211-IgE Ves v 1: <0.1 kU/L
I214-IgE Api m 2: <0.1 kU/L
I215-IgE Api m 3: <0.1 kU/L
I216-IgE Api m 5: <0.1 kU/L
I217-IgE Api m 10: <0.1 kU/L
Tryptase: 4.6 ug/L (ref 2.2–13.2)

## 2024-09-13 LAB — ALLERGEN COMPONENT COMMENTS

## 2024-09-26 ENCOUNTER — Other Ambulatory Visit: Payer: Self-pay | Admitting: Medical Genetics

## 2024-09-26 ENCOUNTER — Ambulatory Visit: Admitting: Family Medicine

## 2024-09-26 ENCOUNTER — Encounter: Payer: Self-pay | Admitting: Family Medicine

## 2024-09-26 VITALS — BP 123/74 | HR 82 | Temp 97.8°F | Ht 67.5 in | Wt 210.7 lb

## 2024-09-26 DIAGNOSIS — Z789 Other specified health status: Secondary | ICD-10-CM

## 2024-09-26 DIAGNOSIS — Z820 Family history of epilepsy and other diseases of the nervous system: Secondary | ICD-10-CM

## 2024-09-26 DIAGNOSIS — F419 Anxiety disorder, unspecified: Secondary | ICD-10-CM | POA: Insufficient documentation

## 2024-09-26 DIAGNOSIS — Z0000 Encounter for general adult medical examination without abnormal findings: Secondary | ICD-10-CM

## 2024-09-26 DIAGNOSIS — Z23 Encounter for immunization: Secondary | ICD-10-CM

## 2024-09-26 DIAGNOSIS — Z136 Encounter for screening for cardiovascular disorders: Secondary | ICD-10-CM

## 2024-09-26 DIAGNOSIS — Z130 Encounter for screening for diseases of the blood and blood-forming organs and certain disorders involving the immune mechanism: Secondary | ICD-10-CM

## 2024-09-26 DIAGNOSIS — E66811 Obesity, class 1: Secondary | ICD-10-CM

## 2024-09-26 DIAGNOSIS — F902 Attention-deficit hyperactivity disorder, combined type: Secondary | ICD-10-CM

## 2024-09-26 DIAGNOSIS — F410 Panic disorder [episodic paroxysmal anxiety] without agoraphobia: Secondary | ICD-10-CM

## 2024-09-26 NOTE — Progress Notes (Signed)
 "   New patient visit   Patient: Jessica Aguilar   DOB: 04-Mar-1986   39 y.o. Female  MRN: [id number] Visit Date: 09/26/2024  Today's healthcare provider: LAURAINE LOISE BUOY, DO   Chief Complaint  Patient presents with   New Patient (Initial Visit)    Patient is here to establish care with a primary provider, wants a referral to psychiatry.  All vaccines yes   Subjective    Jessica Aguilar is a 39 y.o. female who presents today as a new patient to establish care.  HPI HPI     New Patient (Initial Visit)    Additional comments: Patient is here to establish care with a primary provider, wants a referral to psychiatry.  All vaccines yes      Last edited by Terrel Powell CROME, CMA on 09/26/2024  2:46 PM.       Jessica Aguilar is a 39 year old female with anxiety, ADHD, and depression who presents for establishment of care and management of her mental health conditions.  She experiences anxiety and panic attacks, particularly at night when trying to fall asleep, which sometimes causes chest pain and a sensation of her heart doing 'somersaults'. She has undergone various tests, including wearing heart monitors and having an ultrasound, but no abnormalities have been detected. She experiences occasional snoring and has woken up gasping for breath due to anxiety attacks. She reports that she does not experience chest pain or shortness of breath during physical activity.  She has a history of ADHD, describing herself as both hyperactive and inattentive. She is seeking a referral to psychiatry for management of her ADHD and anxiety.  She mentions a past abnormal Pap smear and a history of multiple sexual partners in her twenties, which led to her choice to have frequent screening for sexually transmitted infections during that period.   She reports a history of being misdiagnosed with narcissistic tendencies and borderline personality disorder by student doctors in 2016. A subsequent evaluation by  a psychiatrist negated these diagnoses, attributing her symptoms to her upbringing and environment. She is currently in therapy for anger issues and would like to be referred for additional counseling.  She is concerned about her weight and is interested in weight management options. She wants to return to activities like dance and figure skating to improve her fitness.  Family history includes Alzheimer's, CHF, and COPD. No family history of early sudden cardiac death. No family members smoke.  HIV 1 and  2 Not detected when tested on 09/03/09    Past Medical History:  Diagnosis Date   ADHD    Allergy     Anxiety    Depression    Fracture of elbow with routine healing, right    Fracture of toe of left foot    Fracture of wrist    History of anemia    History of urinary tract infection    Ovarian cyst    Past Surgical History:  Procedure Laterality Date   DENTAL SURGERY Bilateral 2010   wisdom tooth removal x4     Family Status  Relation Name Status   Mother  Alive   Father  Alive   Sister  Alive       Pituitary   MGM  Alive   MGF  Deceased   PGM  Deceased   PGF  Deceased   Neg Hx  (Not Specified)  No partnership data on file   Family History  Problem Relation Age of Onset  Allergic rhinitis Mother    Sinusitis Mother    Hearing loss Mother    Diabetes Father    Kidney disease Father    Hypertension Father    COPD Father    Alcohol abuse Father    Learning disabilities Father    Vision loss Father    Obesity Father    Endocrine tumor Sister    Learning disabilities Sister    Intellectual disability Sister    Heart failure Maternal Grandmother    Arthritis Maternal Grandmother    Heart disease Maternal Grandmother    Lung disease Maternal Grandfather    COPD Maternal Grandfather    Depression Paternal Grandmother    Breast cancer Paternal Grandmother    Cancer Paternal Grandmother    Miscarriages / Stillbirths Paternal Grandmother    Diabetes Paternal  Grandfather    Stroke Paternal Grandfather    Asthma Neg Hx    Angioedema Neg Hx    Eczema Neg Hx    Immunodeficiency Neg Hx    Atopy Neg Hx    Social History   Socioeconomic History   Marital status: Married    Spouse name: Not on file   Number of children: 0   Years of education: 14   Highest education level: Some college, no degree  Occupational History   Not on file  Tobacco Use   Smoking status: Never    Passive exposure: Never   Smokeless tobacco: Never  Vaping Use   Vaping status: Former   Substances: Nicotine  Substance and Sexual Activity   Alcohol use: Yes    Alcohol/week: 6.0 standard drinks of alcohol    Types: 3 Glasses of wine, 1 Shots of liquor, 2 Standard drinks or equivalent per week    Comment: Big on my wine and martinis, and ciders not really a fan of beer anymore   Drug use: Never   Sexual activity: Yes    Partners: Male    Birth control/protection: None    Comment: not at the moment  Other Topics Concern   Not on file  Social History Narrative   ** Merged History Encounter **       Social Drivers of Health   Tobacco Use: Low Risk (09/26/2024)   Patient History    Smoking Tobacco Use: Never    Smokeless Tobacco Use: Never    Passive Exposure: Never  Financial Resource Strain: Medium Risk (09/25/2024)   Overall Financial Resource Strain (CARDIA)    Difficulty of Paying Living Expenses: Somewhat hard  Food Insecurity: No Food Insecurity (09/25/2024)   Epic    Worried About Radiation Protection Practitioner of Food in the Last Year: Never true    Ran Out of Food in the Last Year: Never true  Transportation Needs: No Transportation Needs (09/25/2024)   Epic    Lack of Transportation (Medical): No    Lack of Transportation (Non-Medical): No  Physical Activity: Sufficiently Active (09/25/2024)   Exercise Vital Sign    Days of Exercise per Week: 5 days    Minutes of Exercise per Session: 30 min  Stress: Stress Concern Present (09/25/2024)   [employer] of  Occupational Health - Occupational Stress Questionnaire    Feeling of Stress: Rather much  Social Connections: Moderately Integrated (09/25/2024)   Social Connection and Isolation Panel    Frequency of Communication with Friends and Family: More than three times a week    Frequency of Social Gatherings with Friends and Family: Never    Attends Religious Services:  More than 4 times per year    Active Member of Clubs or Organizations: No    Attends Banker Meetings: Not on file    Marital Status: Married  Depression (PHQ2-9): High Risk (09/26/2024)   Depression (PHQ2-9)    PHQ-2 Score: 12  Alcohol Screen: Low Risk (09/25/2024)   Alcohol Screen    Last Alcohol Screening Score (AUDIT): 7  Housing: Low Risk (09/25/2024)   Epic    Unable to Pay for Housing in the Last Year: No    Number of Times Moved in the Last Year: 0    Homeless in the Last Year: No  Utilities: Not on file  Health Literacy: Not on file   Show/hide medication list[1] Allergies[2]  Immunization History  Administered Date(s) Administered   Influenza,inj,Quad PF,6+ Mos 07/14/2014   Influenza-Unspecified 10/08/2009, 10/30/2022, 05/15/2023   PFIZER(Purple Top)SARS-COV-2 Vaccination 11/10/2019, 12/03/2019   PNEUMOCOCCAL CONJUGATE-20 09/26/2024   PPD Test 07/31/2014, 08/07/2014   Tdap 09/26/2024    Health Maintenance  Topic Date Due   Influenza Vaccine  11/18/2024 (Originally 03/21/2024)   Hepatitis B Vaccines 19-59 Average Risk (1 of 3 - 19+ 3-dose series) 12/05/2024 (Originally 05/25/2007)   COVID-19 Vaccine (3 - 2025-26 season) 04/20/2025 (Originally 04/21/2024)   Hepatitis C Screening  09/26/2025 (Originally 05/24/2006)   HIV Screening  09/26/2025 (Originally 05/25/2003)   Cervical Cancer Screening (HPV/Pap Cotest)  10/13/2026   DTaP/Tdap/Td (2 - Td or Tdap) 09/26/2034   Pneumococcal Vaccine  Completed   HPV VACCINES (No Doses Required) Completed   Meningococcal B Vaccine  Aged Out    Patient Care  Team: Delisa Finck, Lauraine SAILOR, DO as PCP - General (Family Medicine) Pcp, No  Review of Systems  Constitutional:  Negative for chills, fatigue and fever.  HENT:  Negative for congestion, ear pain, rhinorrhea, sneezing and sore throat.   Eyes: Negative.  Negative for pain and redness.  Respiratory:  Positive for shortness of breath (at night when trying to go to sleep; pt attributes to panic). Negative for cough and wheezing.   Cardiovascular:  Positive for palpitations (feels as though heart beat is briefly abnormal when she shifts to her right side.). Negative for chest pain and leg swelling.  Gastrointestinal:  Negative for abdominal pain, blood in stool, constipation, diarrhea and nausea.  Endocrine: Negative for polydipsia and polyphagia.  Genitourinary: Negative.  Negative for dysuria, flank pain, hematuria, pelvic pain, vaginal bleeding and vaginal discharge.  Musculoskeletal:  Negative for arthralgias, back pain, gait problem and joint swelling.  Skin:  Negative for rash.  Neurological: Negative.  Negative for dizziness, tremors, seizures, weakness, light-headedness, numbness and headaches.  Hematological:  Negative for adenopathy.  Psychiatric/Behavioral:  Negative for behavioral problems, confusion and dysphoric mood. The patient is nervous/anxious. The patient is not hyperactive.         Objective    BP 123/74 (BP Location: Right Arm, Patient Position: Sitting)   Pulse 82   Temp 97.8 F (36.6 C) (Oral)   Ht 5' 7.5 (1.715 m)   Wt 210 lb 11.2 oz (95.6 kg)   SpO2 98%   BMI 32.51 kg/m     Physical Exam Vitals and nursing note reviewed.  Constitutional:      General: She is awake.     Appearance: Normal appearance. She is obese.  HENT:     Head: Normocephalic and atraumatic.     Right Ear: Tympanic membrane, ear canal and external ear normal.     Left Ear: Tympanic membrane, ear  canal and external ear normal.     Nose: Nose normal.     Mouth/Throat:     Mouth: Mucous  membranes are moist.     Pharynx: Oropharynx is clear. No oropharyngeal exudate or posterior oropharyngeal erythema.  Eyes:     General: No scleral icterus.    Extraocular Movements: Extraocular movements intact.     Conjunctiva/sclera: Conjunctivae normal.     Pupils: Pupils are equal, round, and reactive to light.  Neck:     Thyroid : No thyromegaly or thyroid  tenderness.  Cardiovascular:     Rate and Rhythm: Normal rate and regular rhythm.     Pulses: Normal pulses.     Heart sounds: Normal heart sounds.  Pulmonary:     Effort: Pulmonary effort is normal. No tachypnea, bradypnea or respiratory distress.     Breath sounds: Normal breath sounds. No stridor. No wheezing, rhonchi or rales.  Abdominal:     General: Bowel sounds are normal. There is no distension.     Palpations: Abdomen is soft. There is no mass.     Tenderness: There is no abdominal tenderness. There is no guarding.     Hernia: No hernia is present.  Musculoskeletal:     Cervical back: Normal range of motion and neck supple.     Right lower leg: No edema.     Left lower leg: No edema.  Lymphadenopathy:     Cervical: No cervical adenopathy.  Skin:    General: Skin is warm and dry.  Neurological:     Mental Status: She is alert and oriented to person, place, and time. Mental status is at baseline.  Psychiatric:        Mood and Affect: Mood normal.        Behavior: Behavior normal.     Depression Screen    09/26/2024    2:54 PM 11/01/2021    3:06 PM 12/31/2020    9:53 AM  PHQ 2/9 Scores  PHQ - 2 Score 4 5 1   PHQ- 9 Score 12 19  3       Data saved with a previous flowsheet row definition   No results found for any visits on 09/26/24.  Assessment & Plan     Encounter for medical examination to establish care  Obesity (BMI 30.0-34.9)  Generalized anxiety disorder with panic attacks -     Ambulatory referral to Psychiatry  Attention deficit hyperactivity disorder (ADHD), combined type -     Ambulatory  referral to Psychiatry  Screening for endocrine, metabolic and immunity disorder -     Comprehensive metabolic panel with GFR  Encounter for screening for cardiovascular disorders -     Lipid panel  Hepatitis B vaccination status unknown -     Hepatitis B surface antibody,qualitative  Family history of Alzheimer disease -     APOE ALZHEIMER'S DISEASE RISK  Need for Tdap vaccination -     Tdap vaccine greater than or equal to 7yo IM  Need for pneumococcal vaccination -     Pneumococcal conjugate vaccine 20-valent     Encounter for medication examination to establish care Physical exam overall unremarkable except as noted above. Routine lab work ordered as noted. Up to date on vaccinations. Due for Pap smear in 2028. Interested in GLP1s for weight loss but concerned about side effects. No personal or family history of pancreatitis, medullary thyroid  cancer, or MEN2. Interested in resuming dance and figure skating for physical activity. - Continue routine health maintenance and screenings. -  Discussed risks and benefits of GLP-1 medications for weight management. - Advised caution with figure skating due to injury risk with age.  Recommended slow restart of physical activity and gently increase in duration/intensity over time.  Obesity (BMI 30.0-34.9) Noted.  BMI today 32.51.  Discussed various medication options for weight management.  Patient prefers to reattempt lifestyle modifications.  Counseled patient on lifestyle modifications and caloric deficit diet.    Generalized anxiety disorder with panic attacks Experiences anxiety and panic attacks, especially at night. Cardiac evaluations normal. Anxiety may contribute to palpitations. - Continue therapy for anxiety management. - Consider psychiatric referral for further evaluation and management.  Attention-deficit hyperactivity disorder, combined type Diagnosed with ADHD, combined type. Plans to address ADHD after educational  goals. -Send psychiatric referral for ADHD management post-education.    Return in about 3 months (around 12/24/2024) for Weight w/next provider.     I discussed the assessment and treatment plan with the patient  The patient was provided an opportunity to ask questions and all were answered. The patient agreed with the plan and demonstrated an understanding of the instructions.   The patient was advised to call back or seek an in-person evaluation if the symptoms worsen or if the condition fails to improve as anticipated.    LAURAINE LOISE BUOY, DO  [facility] [phone] (phone) [phone] (fax)  [facility]    [1]  Outpatient Medications Prior to Visit  Medication Sig   albuterol  (VENTOLIN  HFA) 108 (90 Base) MCG/ACT inhaler Inhale into the lungs.   EPINEPHrine  0.3 mg/0.3 mL IJ SOAJ injection Inject 0.3 mg into the muscle as needed for anaphylaxis.   No facility-administered medications prior to visit.  [2]  Allergies Allergen Reactions   Amoxicillin Hives and Rash   "

## 2024-09-26 NOTE — Patient Instructions (Signed)
 Increase your intake of fresh/frozen fruits and vegetables.  The Mediterranean diet is a great reference for meal ideas.  I strongly encourage you to incorporate exercise into your daily routine (at least 30 minutes most days of the week) and monitor your dietary intake. Caloric goal: 1700-1900 calories per day. Along with focusing on increasing your physical activity; this should also you to reach your goal weight of 165 lbs in one year.  - I encourage you to measure/weigh your foods/beverages for a few days to get a more accurate idea of what different amounts of things look like on your plate or in your glass.  - Using smaller plates/glasses also helps in that it tricks our minds into thinking we've eaten more than we have. Studies have shown that we eat more when presented with larger plates, even with the same amount of food on them.   - Plan to eat until you are no longer hungry, rather than until you're full.  If you don't normally exercise, start with something simple or more enjoyable. You can plan to walk for your half hour or do something like dancing if you enjoy it.  Build up your activity over time; this will make it more enjoyable and reduce your risk of injury.  Here are some videos which offer a variety of different workout classes with different levels: https://[email or web address].com/ It is completely free. If a full video is too much for you to do, you can do them in bite-sized chunks (just pausing when needed and resuming later in the day).   Even if these actions don't ultimately lead to weight loss, increasing your activity will still help to reduce your insulin resistance and will improve your cardiovascular health (making heart attack/stroke less likely as you age).

## 2024-12-24 ENCOUNTER — Ambulatory Visit
